# Patient Record
Sex: Female | Born: 1965 | Race: White | Hispanic: No | Smoking: Never smoker
Health system: Southern US, Community
[De-identification: ages and names within clinical notes are randomized; demographics above are authoritative.]

## PROBLEM LIST (undated history)

## (undated) DIAGNOSIS — J302 Other seasonal allergic rhinitis: Secondary | ICD-10-CM

## (undated) DIAGNOSIS — R011 Cardiac murmur, unspecified: Secondary | ICD-10-CM

## (undated) DIAGNOSIS — L52 Erythema nodosum: Secondary | ICD-10-CM

## (undated) HISTORY — DX: Cardiac murmur, unspecified: R01.1

## (undated) HISTORY — DX: Erythema nodosum: L52

## (undated) HISTORY — DX: Other seasonal allergic rhinitis: J30.2

---

## 1993-01-08 HISTORY — PX: CHOLECYSTECTOMY: SHX55

## 1997-02-14 ENCOUNTER — Inpatient Hospital Stay (HOSPITAL_COMMUNITY): Admission: AD | Admit: 1997-02-14 | Discharge: 1997-02-14 | Payer: Self-pay | Admitting: Family Medicine

## 1997-04-05 ENCOUNTER — Inpatient Hospital Stay (HOSPITAL_COMMUNITY): Admission: AD | Admit: 1997-04-05 | Discharge: 1997-04-07 | Payer: Self-pay | Admitting: Obstetrics and Gynecology

## 1997-05-10 ENCOUNTER — Other Ambulatory Visit: Admission: RE | Admit: 1997-05-10 | Discharge: 1997-05-10 | Payer: Self-pay | Admitting: Obstetrics and Gynecology

## 1998-06-23 ENCOUNTER — Other Ambulatory Visit: Admission: RE | Admit: 1998-06-23 | Discharge: 1998-06-23 | Payer: Self-pay | Admitting: Obstetrics and Gynecology

## 1999-01-12 ENCOUNTER — Ambulatory Visit (HOSPITAL_COMMUNITY): Admission: RE | Admit: 1999-01-12 | Discharge: 1999-01-12 | Payer: Self-pay | Admitting: Obstetrics and Gynecology

## 1999-04-19 ENCOUNTER — Ambulatory Visit (HOSPITAL_COMMUNITY): Admission: RE | Admit: 1999-04-19 | Discharge: 1999-04-19 | Payer: Self-pay | Admitting: Obstetrics & Gynecology

## 1999-07-01 ENCOUNTER — Inpatient Hospital Stay (HOSPITAL_COMMUNITY): Admission: AD | Admit: 1999-07-01 | Discharge: 1999-07-03 | Payer: Self-pay | Admitting: Obstetrics and Gynecology

## 1999-08-01 ENCOUNTER — Other Ambulatory Visit: Admission: RE | Admit: 1999-08-01 | Discharge: 1999-08-01 | Payer: Self-pay | Admitting: Obstetrics and Gynecology

## 1999-08-24 ENCOUNTER — Encounter: Payer: Self-pay | Admitting: Family Medicine

## 1999-08-24 ENCOUNTER — Encounter: Admission: RE | Admit: 1999-08-24 | Discharge: 1999-08-24 | Payer: Self-pay | Admitting: Family Medicine

## 2000-08-29 ENCOUNTER — Other Ambulatory Visit: Admission: RE | Admit: 2000-08-29 | Discharge: 2000-08-29 | Payer: Self-pay | Admitting: Obstetrics and Gynecology

## 2001-09-25 ENCOUNTER — Other Ambulatory Visit: Admission: RE | Admit: 2001-09-25 | Discharge: 2001-09-25 | Payer: Self-pay | Admitting: Obstetrics and Gynecology

## 2001-11-11 ENCOUNTER — Inpatient Hospital Stay (HOSPITAL_COMMUNITY): Admission: AD | Admit: 2001-11-11 | Discharge: 2001-11-11 | Payer: Self-pay | Admitting: Obstetrics and Gynecology

## 2002-02-16 ENCOUNTER — Ambulatory Visit (HOSPITAL_COMMUNITY): Admission: RE | Admit: 2002-02-16 | Discharge: 2002-02-16 | Payer: Self-pay | Admitting: Obstetrics & Gynecology

## 2002-05-04 ENCOUNTER — Inpatient Hospital Stay (HOSPITAL_COMMUNITY): Admission: AD | Admit: 2002-05-04 | Discharge: 2002-05-06 | Payer: Self-pay | Admitting: Obstetrics and Gynecology

## 2002-11-03 ENCOUNTER — Other Ambulatory Visit: Admission: RE | Admit: 2002-11-03 | Discharge: 2002-11-03 | Payer: Self-pay | Admitting: Obstetrics and Gynecology

## 2003-11-09 ENCOUNTER — Other Ambulatory Visit: Admission: RE | Admit: 2003-11-09 | Discharge: 2003-11-09 | Payer: Self-pay | Admitting: Obstetrics and Gynecology

## 2004-07-28 ENCOUNTER — Ambulatory Visit: Payer: Self-pay

## 2004-12-21 ENCOUNTER — Other Ambulatory Visit: Admission: RE | Admit: 2004-12-21 | Discharge: 2004-12-21 | Payer: Self-pay | Admitting: Obstetrics and Gynecology

## 2010-07-24 ENCOUNTER — Other Ambulatory Visit: Payer: Self-pay | Admitting: Obstetrics and Gynecology

## 2012-04-28 ENCOUNTER — Encounter: Payer: Self-pay | Admitting: *Deleted

## 2012-04-29 ENCOUNTER — Encounter: Payer: Self-pay | Admitting: Cardiovascular Disease

## 2012-04-29 ENCOUNTER — Ambulatory Visit (INDEPENDENT_AMBULATORY_CARE_PROVIDER_SITE_OTHER): Payer: 59 | Admitting: Cardiovascular Disease

## 2012-04-29 VITALS — BP 118/78 | HR 58 | Resp 12 | Ht 65.0 in | Wt 129.0 lb

## 2012-04-29 DIAGNOSIS — R000 Tachycardia, unspecified: Secondary | ICD-10-CM | POA: Insufficient documentation

## 2012-04-29 DIAGNOSIS — I498 Other specified cardiac arrhythmias: Secondary | ICD-10-CM

## 2012-04-29 NOTE — Assessment & Plan Note (Signed)
Gabriella Grimes presents with a prolonged episode of sinus tachycardia that started during a rigorous game of tennis.  She had recently recovered from a GI virus several days earlier and was probably still slightly volume depleted.  She has not had any recurrent episodes of tachycardia.  At this point, I do not think she needs any further testing.  Her cardiac exam is normal.  We discussed getting an echo and or event monitor if she continues to have these episodes.  I have encouraged her to drink V-8 juice on occasion to hopefully avoid volume depletion.  I will see her on as needed basis.

## 2012-04-29 NOTE — Patient Instructions (Addendum)
Your physician recommends that you schedule a follow-up appointment in: as needed basis  

## 2012-04-29 NOTE — Progress Notes (Signed)
     Gabriella Grimes Date of Birth  December 30, 1965       [facility]    [place] [address]  [address], [address] [place], [place]  [address]   [place], [place]  [address] [phone]     [phone]   Fax  [phone]    Fax [phone]  Problem List: 1. Tachycardia 2. 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   She is healthy - exercises regularly.  Recently she had a tough tennis match and had prolonged tachycardia for an hour or so.  The tachycardia started during the match.   Her HR was not irregular.  She felt "nervous".  She measured her pulse at 105.  She had hydrated well during the match.   She had a stomach virus 3-4 days prior.  She remembers not urinating much following that stomach virus.    She has had some mild left leg swelling.  She is an optometrist.   + family history - mother had a pacer.  Her biological father died suddenly while playing basketball at age 32.    No current outpatient prescriptions on file prior to visit.   No current facility-administered medications on file prior to visit.    No Known Allergies  Past Medical History  Diagnosis Date  . Seasonal allergies   . Erythema nodosum   . Heart murmur     MVP    Past Surgical History  Procedure Laterality Date  . Cholecystectomy  1995    History  Smoking status  . Never Smoker   Smokeless tobacco  . Not on file    History  Alcohol Use  . Yes    Comment: 1-2 per wk    No family history on file.  Reviw of Systems:  Reviewed in the HPI.  All other systems are negative.  Physical Exam: Blood pressure 118/78, pulse 58, resp. rate 12, height 5\' 5"  (1.651 m), weight 129 lb (58.514 kg). General: Well developed, well nourished, in no acute distress.  Head: Normocephalic, atraumatic, sclera non-icteric, mucus membranes are moist,    Neck: Supple. Carotids are 2 + without bruits. No JVD   Lungs: Clear   Heart: RR, normal S1, S2,   Abdomen: Soft, non-tender, non-distended with normal bowel sounds.  Msk:  Strength and tone are normal   Extremities: No clubbing or cyanosis. No edema.  Distal pedal pulses are 2+ and equal    Neuro: CN II - XII intact.  Alert and oriented X 3.   Psych:  Normal   ECG: April 29, 2012:  Sinus brady 54.early repol.  Assessment / Plan:

## 2012-05-26 ENCOUNTER — Encounter: Payer: Self-pay | Admitting: Cardiovascular Disease

## 2012-09-15 ENCOUNTER — Other Ambulatory Visit: Payer: Self-pay | Admitting: Obstetrics and Gynecology

## 2012-10-08 ENCOUNTER — Ambulatory Visit (INDEPENDENT_AMBULATORY_CARE_PROVIDER_SITE_OTHER): Payer: 59 | Admitting: Cardiovascular Disease

## 2012-10-08 ENCOUNTER — Encounter: Payer: Self-pay | Admitting: Cardiovascular Disease

## 2012-10-08 VITALS — BP 110/64 | HR 63 | Ht 65.0 in | Wt 126.0 lb

## 2012-10-08 DIAGNOSIS — R0789 Other chest pain: Secondary | ICD-10-CM | POA: Insufficient documentation

## 2012-10-08 DIAGNOSIS — R002 Palpitations: Secondary | ICD-10-CM

## 2012-10-08 DIAGNOSIS — R000 Tachycardia, unspecified: Secondary | ICD-10-CM

## 2012-10-08 DIAGNOSIS — R600 Localized edema: Secondary | ICD-10-CM

## 2012-10-08 DIAGNOSIS — I498 Other specified cardiac arrhythmias: Secondary | ICD-10-CM

## 2012-10-08 DIAGNOSIS — R609 Edema, unspecified: Secondary | ICD-10-CM

## 2012-10-08 DIAGNOSIS — R079 Chest pain, unspecified: Secondary | ICD-10-CM

## 2012-10-08 NOTE — Assessment & Plan Note (Signed)
And presents today with some atypical episodes of chest discomfort. She sleep on her side and notes that she has date chest pain and tightness which wakes her up middle of the night. He doesn't typically improve if she rolls over or stretches that her chest.  The symptoms are very atypical. She is a Sports coach and so I don't think that she's got significant coronary artery disease. We I do want to get an echocardiogram to make sure that she doesn't have any other structural cardiac or pulmonary abnormalities.  Have asked her to sit up straight stretch her chest out which should help if this is a musculoskeletal issue.

## 2012-10-08 NOTE — Progress Notes (Signed)
        Gabriella Grimes Date of Birth  04-Dec-1965       [facility]    [place] [address]  [address] [place], [place]  [address]   [place], [place]  [address] [phone]     [phone]   Fax  [phone]    Fax [phone]  Problem List: 1. Tachycardia 2. 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   She is healthy - exercises regularly.  Recently she had a tough tennis match and had prolonged tachycardia for an hour or so.  The tachycardia started during the match.   Her HR was not irregular.  She felt "nervous".  She measured her pulse at 105.  She had hydrated well during the match.  She had a stomach virus 3-4 days prior.  She remembers not urinating much following that stomach virus.   She has had some mild left leg swelling.  She is an optometrist.   + family history - mother had a pacer.  Her biological father died suddenly while playing basketball at age 61.  Oct. 1, 2014:  Gabriella Grimes has done well . She has had some episodes of tachycardia - especially after playing tennis.  HR is the 105-110 range.   She has had some CP what awakens her at night.  The pain is a nagging pain.  Just left of her sternum.  She sleeps on her left side.    She also has some episodes of pitting edema.    It seems to be mostly in her left foot.   The swelling always resolves by the next morning.  Family hx of cad - father had MI at age 102 and dropped dead on basketball court.    No current outpatient prescriptions on file prior to visit.   No current facility-administered medications on file prior to visit.    No Known Allergies  Past Medical History  Diagnosis Date  . Seasonal allergies   . Erythema nodosum   . Heart murmur     MVP    Past Surgical History  Procedure Laterality Date  . Cholecystectomy  1995     History  Smoking status  . Never Smoker   Smokeless tobacco  . Not on file    History  Alcohol Use  . Yes    Comment: 1-2 per wk    No family history on file.  Reviw of Systems:  Reviewed in the HPI.  All other systems are negative.  Physical Exam: Blood pressure 110/64, pulse 63, height 5\' 5"  (1.651 m), weight 126 lb (57.153 kg), SpO2 93.00%. General: Well developed, well nourished, in no acute distress.  Head: Normocephalic, atraumatic, sclera non-icteric, mucus membranes are moist,   Neck: Supple. Carotids are 2 + without bruits. No JVD   Lungs: Clear   Heart: RR, normal S1, S2,  No chest wall tenderness  Abdomen: Soft, non-tender, non-distended with normal bowel sounds.  Msk:  Strength and tone are normal   Extremities: No clubbing or cyanosis. No edema.  Distal pedal pulses are 2+ and equal    Neuro: CN II - XII intact.  Alert and oriented X 3.   Psych:  Normal   ECG: April 29, 2012:  Sinus brady 54.early repol.  Assessment / Plan:

## 2012-10-08 NOTE — Assessment & Plan Note (Signed)
She continues to have some episodes of sinus tachycardia although they're not as bad as they were previously.

## 2012-10-08 NOTE — Assessment & Plan Note (Signed)
Gabriella Grimes is no edema on exam today. She should be several pictures on her phone that showed significant 2+ pitting edema. She does not recall eating a lot of salty foods prior to setting. She's had this off and on for the past several years. She's been seen by an orthopedist and son have no abnormalities, structural standpoint. I tried to get an echocardiogram for further evaluation of her left ventricular function and also right ventricular function. Was negative venous duplex and mentioned that she doesn't have any evidence of venous thrombosis. She does have a history of leg fractures in both legs as a child.

## 2012-10-08 NOTE — Patient Instructions (Addendum)
Your physician has requested that you have an echocardiogram. Echocardiography is a painless test that uses sound waves to create images of your heart. It provides your doctor with information about the size and shape of your heart and how well your heart's chambers and valves are working. This procedure takes approximately one hour. There are no restrictions for this procedure.  Your physician has requested that you have a lower  extremity venous duplex. This test is an ultrasound of the veins in the legs or arms. It looks at venous blood flow that carries blood from the heart to the legs or arms. Allow one hour for a Lower Venous exam. Allow thirty minutes for an Upper Venous exam. There are no restrictions or special instructions.  Your physician recommends that you schedule a follow-up appointment in: 2-3 months

## 2012-10-22 ENCOUNTER — Ambulatory Visit (HOSPITAL_COMMUNITY): Payer: 59 | Attending: Cardiovascular Disease | Admitting: Radiology

## 2012-10-22 ENCOUNTER — Other Ambulatory Visit (HOSPITAL_COMMUNITY): Payer: 59

## 2012-10-22 DIAGNOSIS — Z8249 Family history of ischemic heart disease and other diseases of the circulatory system: Secondary | ICD-10-CM | POA: Insufficient documentation

## 2012-10-22 DIAGNOSIS — R600 Localized edema: Secondary | ICD-10-CM

## 2012-10-22 DIAGNOSIS — R609 Edema, unspecified: Secondary | ICD-10-CM | POA: Insufficient documentation

## 2012-10-22 DIAGNOSIS — R002 Palpitations: Secondary | ICD-10-CM

## 2012-10-22 DIAGNOSIS — R072 Precordial pain: Secondary | ICD-10-CM

## 2012-10-22 DIAGNOSIS — I079 Rheumatic tricuspid valve disease, unspecified: Secondary | ICD-10-CM | POA: Insufficient documentation

## 2012-10-22 DIAGNOSIS — R079 Chest pain, unspecified: Secondary | ICD-10-CM | POA: Insufficient documentation

## 2012-10-22 NOTE — Progress Notes (Signed)
Echocardiogram performed.  

## 2012-10-29 ENCOUNTER — Ambulatory Visit (HOSPITAL_COMMUNITY): Payer: 59 | Attending: Cardiovascular Disease

## 2012-10-29 DIAGNOSIS — R002 Palpitations: Secondary | ICD-10-CM

## 2012-10-29 DIAGNOSIS — I059 Rheumatic mitral valve disease, unspecified: Secondary | ICD-10-CM | POA: Insufficient documentation

## 2012-10-29 DIAGNOSIS — M7989 Other specified soft tissue disorders: Secondary | ICD-10-CM | POA: Insufficient documentation

## 2012-10-29 DIAGNOSIS — R609 Edema, unspecified: Secondary | ICD-10-CM

## 2012-10-29 DIAGNOSIS — R600 Localized edema: Secondary | ICD-10-CM

## 2012-10-29 DIAGNOSIS — R079 Chest pain, unspecified: Secondary | ICD-10-CM

## 2012-12-29 ENCOUNTER — Ambulatory Visit: Payer: 59 | Admitting: Cardiovascular Disease

## 2013-01-21 ENCOUNTER — Ambulatory Visit: Payer: 59 | Admitting: Cardiovascular Disease

## 2013-02-18 ENCOUNTER — Ambulatory Visit: Payer: 59 | Admitting: Cardiovascular Disease

## 2013-02-20 ENCOUNTER — Encounter: Payer: Self-pay | Admitting: Cardiovascular Disease

## 2013-02-20 ENCOUNTER — Ambulatory Visit (INDEPENDENT_AMBULATORY_CARE_PROVIDER_SITE_OTHER): Payer: 59 | Admitting: Cardiovascular Disease

## 2013-02-20 VITALS — BP 100/70 | HR 62 | Ht 65.0 in | Wt 131.0 lb

## 2013-02-20 DIAGNOSIS — R0789 Other chest pain: Secondary | ICD-10-CM

## 2013-02-20 DIAGNOSIS — I498 Other specified cardiac arrhythmias: Secondary | ICD-10-CM

## 2013-02-20 DIAGNOSIS — R000 Tachycardia, unspecified: Secondary | ICD-10-CM

## 2013-02-20 NOTE — Patient Instructions (Signed)
Your physician recommends that you schedule a follow-up appointment in: AS NEEDED  Your physician recommends that you continue on your current medications as directed. Please refer to the Current Medication list given to you today.  

## 2013-02-20 NOTE — Progress Notes (Signed)
Gabriella Grimes Date of Birth  10/01/1965       [facility]    [place] [address]  [address] [place], [place]  [address]   [place], [place]  [address] [phone]     [phone]   Fax  [phone]    Fax [phone]  Problem List: 1. Tachycardia 2. 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   She is healthy - exercises regularly.  Recently she had a tough tennis match and had prolonged tachycardia for an hour or so.  The tachycardia started during the match.   Her HR was not irregular.  She felt "nervous".  She measured her pulse at 105.  She had hydrated well during the match.  She had a stomach virus 3-4 days prior.  She remembers not urinating much following that stomach virus.   She has had some mild left leg swelling.  She is an optometrist.   + family history - mother had a pacer.  Her biological father died suddenly while playing basketball at age 65.  Oct. 1, 2014:  Gabriella Grimes has done well . She has had some episodes of tachycardia - especially after playing tennis.  HR is the 105-110 range.   She has had some CP what awakens her at night.  The pain is a nagging pain.  Just left of her sternum.  She sleeps on her left side.    She also has some episodes of pitting edema.    It seems to be mostly in her left foot.   The swelling always resolves by the next morning.  Family hx of cad - father had MI at age 37 and dropped dead on basketball court.  Mar 05, 2013:  Gabriella Grimes is doing ok  .  She has very brief episodes of palpitations.  Also has some mild left chest pain when she is under stress.  Never with exercise.    No current outpatient prescriptions on file prior to visit.   No current facility-administered medications on file prior to visit.    No Known Allergies  Past Medical History    Diagnosis Date  . Seasonal allergies   . Erythema nodosum   . Heart murmur     MVP    Past Surgical History  Procedure Laterality Date  . Cholecystectomy  1995    History  Smoking status  . Never Smoker   Smokeless tobacco  . Not on file    History  Alcohol Use  . Yes    Comment: 1-2 per wk    No family history on file.  Reviw of Systems:  Reviewed in the HPI.  All other systems are negative.  Physical Exam: Blood pressure 100/70, pulse 62, height 5\' 5"  (1.651 m), weight 131 lb (59.421 kg). General: Well developed, well nourished, in no acute distress.  Head: Normocephalic, atraumatic, sclera non-icteric, mucus membranes are moist,   Neck: Supple. Carotids are 2 + without bruits. No JVD   Lungs: Clear   Heart: RR, normal S1, S2,  No chest wall tenderness  Abdomen: Soft, non-tender, non-distended with normal bowel sounds.  Msk:  Strength and tone are normal   Extremities: No clubbing or cyanosis. No edema.  Distal pedal pulses are 2+ and equal    Neuro: CN II - XII intact.  Alert and oriented X 3.   Psych:  Normal   ECG:  02/20/2013: Normal sinus rhythm at 62. She has no ST or T wave changes.  Assessment / Plan:

## 2013-02-22 NOTE — Assessment & Plan Note (Signed)
Gabriella Grimes continues to have some palpitations but nothing severe.  I think that her symptoms are benign.    We will continue to follow .

## 2013-09-23 ENCOUNTER — Other Ambulatory Visit: Payer: Self-pay | Admitting: Obstetrics and Gynecology

## 2013-09-24 LAB — CYTOLOGY - PAP

## 2014-01-01 ENCOUNTER — Other Ambulatory Visit: Payer: Self-pay | Admitting: Cardiovascular Disease

## 2014-01-01 ENCOUNTER — Telehealth: Payer: Self-pay | Admitting: Cardiovascular Disease

## 2014-01-01 MED ORDER — PROPRANOLOL HCL 10 MG PO TABS: 10.0000 mg | ORAL_TABLET | Freq: Four times a day (QID) | ORAL | Status: DC | PRN

## 2014-01-01 NOTE — Telephone Encounter (Signed)
Received text from [facility] that she was having significant palpitations tonight I made a house call and discussed these with her and her husband On exam BP 115/80 HR 80-90 Heart :  Very irregular followed by several normal beats .  soft systolic murmur and soft diastolic murmur.  No excessive ETOH intake.  No significant caffeine I suspect that she has paroxysmal atrial fib. She is basically asymptomatic.  No CP , no dizziness.  CHADS VASC2 score is 0.  Will send in a script for porpranolol 10 mg QID PRN to [facility], [employer]  I will check in with her tomorrow. If she is still in this irregular rhythm on Monday,  Will have her come by for ECG, work in office visit.  I will be in the hospital but can come over for office visit.  She will need an echo.    [name], Montez Hageman., MD, [facility] 01/01/2014, 10:30 PM [address]  [address] Office [phone] Pager [phone]

## 2014-01-01 NOTE — Progress Notes (Signed)
Gabriella Grimes called our house tonight with complaints of palpitations. I made a house call. Discussed with her and her husband, [name],  Patient was examined BP 115/80 HR 80-90 - very irregular followed by periods of RR for 3-4 heart beats.  Soft systolic murmur , ? Soft diastolic murmur on cardiac exam.  Her exam is c/w atrial fibrilation. She has had palpitations in the past but has not been diagnosed with atrial fib..  Will send in a script for propranolol 10 mg QID , # 30. To [facility] on [employer] Encouraged her to increase intake of potassium and magnesium .   I will check in with her tomorrow . She will need an echo  This week.  I can see her briefly for a work in visit and ECG on Monday if she is still in the irregular rhythm .   [name], Montez Hageman., MD, [facility] 01/01/2014, 10:15 PM [address],  [address] Office [phone] Pager [phone]

## 2014-01-04 ENCOUNTER — Telehealth: Payer: Self-pay | Admitting: Cardiovascular Disease

## 2014-01-04 DIAGNOSIS — R002 Palpitations: Secondary | ICD-10-CM

## 2014-01-04 NOTE — Telephone Encounter (Signed)
Left message for patient to call.

## 2014-01-04 NOTE — Addendum Note (Signed)
Addended by: Judy Pimple on: 01/04/2014 02:37 PM   Modules accepted: Orders

## 2014-01-04 NOTE — Telephone Encounter (Signed)
Shiniqua's palpitations have resolved. She has been having some atypical CP  She will need a 30 day event monitor.  I do not think that she needs the echo that I had requested in my previous notes since she had an echo in Oct. 2014.  Please also set up an appt. To see me in several weeks.   [name], Montez Hageman., MD, [facility] 01/04/2014, 9:53 AM [address]  [address] Office [phone] Pager [phone]

## 2014-01-04 NOTE — Telephone Encounter (Signed)
Spoke with patient. She is agreeable to this plan. Will order 30 day event monitor for her. She voiced good understanding.

## 2014-01-05 ENCOUNTER — Encounter: Payer: Self-pay | Admitting: *Deleted

## 2014-01-05 ENCOUNTER — Encounter (INDEPENDENT_AMBULATORY_CARE_PROVIDER_SITE_OTHER): Payer: 59

## 2014-01-05 DIAGNOSIS — R002 Palpitations: Secondary | ICD-10-CM

## 2014-01-05 NOTE — Progress Notes (Signed)
Patient ID: Gabriella Grimes, female   DOB: Feb 09, 1965, 48 y.o.   MRN: [id number] [employer] 30 day cardiac event monitor applied to patient.

## 2014-01-06 ENCOUNTER — Telehealth: Payer: Self-pay | Admitting: Nurse Practitioner

## 2014-01-06 NOTE — Telephone Encounter (Signed)
Called and spoke with patient to schedule appointment with Dr. Elease Hashimoto per his request.  Patient states she is doing well; asked about exercising while wearing heart monitor.  I advised that patient may exercise and to document the date/time so that if an alert is received, we will know that she was exercising during that time.  Patient verbalized understanding and agreement and scheduled for 01/27/14 with Dr. Elease Hashimoto.

## 2014-01-27 ENCOUNTER — Ambulatory Visit (INDEPENDENT_AMBULATORY_CARE_PROVIDER_SITE_OTHER): Payer: BLUE CROSS/BLUE SHIELD | Admitting: Cardiovascular Disease

## 2014-01-27 ENCOUNTER — Encounter: Payer: Self-pay | Admitting: Cardiovascular Disease

## 2014-01-27 VITALS — BP 110/70 | HR 49 | Ht 65.0 in | Wt 130.0 lb

## 2014-01-27 DIAGNOSIS — R0789 Other chest pain: Secondary | ICD-10-CM

## 2014-01-27 DIAGNOSIS — I480 Paroxysmal atrial fibrillation: Secondary | ICD-10-CM

## 2014-01-27 DIAGNOSIS — I4891 Unspecified atrial fibrillation: Secondary | ICD-10-CM | POA: Insufficient documentation

## 2014-01-27 MED ORDER — ASPIRIN EC 81 MG PO TBEC: 81.0000 mg | DELAYED_RELEASE_TABLET | Freq: Every day | ORAL | Status: DC

## 2014-01-27 NOTE — Patient Instructions (Signed)
Your physician has recommended you make the following change in your medication:  START Aspirin 81 mg once daily  Your physician wants you to follow-up in: 1 year with Dr. Elease Hashimoto.  You will receive a reminder letter in the mail two months in advance. If you don't receive a letter, please call our office to schedule the follow-up appointment.

## 2014-01-27 NOTE — Progress Notes (Signed)
Cardiology Office Note   Date:  01/27/2014   ID:  Gabriella Grimes, DOB 1965/05/31, MRN [id number]  PCP:  No primary care provider on file.  Cardiologist:   Nahser, Deloris Ping, MD   Chief Complaint  Patient presents with  . Follow-up    paroxysmal atrial fib      History of Present Illness: Gabriella Grimes is a 49 y.o. female who presents for follow up of her paroxysmal atrial fib.    has had intermittent palpitations and atypical chest pain for the past several years. I saw her during a house visit on December 24. She was having heartbeat irregularities. . Clinically the rhythm felt like atrial fibrillation. She was having some chest discomfort but her rhythm was completely normal. We placed an event monitor.  Event monitor reveals predominantly normal sinus rhythm. She has frequent premature atrial contractions , occasional PVCs. She has also had some runs of paroxysmal atrial fibrillation.  She remains active.  She hurt her back so she has not been as active as she normally is. Runs on occasion.    Past Medical History  Diagnosis Date  . Seasonal allergies   . Erythema nodosum   . Heart murmur     MVP    Past Surgical History  Procedure Laterality Date  . Cholecystectomy  1995     Current Outpatient Prescriptions  Medication Sig Dispense Refill  . propranolol (INDERAL) 10 MG tablet Take 1 tablet (10 mg total) by mouth 4 (four) times daily as needed. (Patient not taking: Reported on 01/27/2014) 30 tablet 12   No current facility-administered medications for this visit.    Allergies:   Review of patient's allergies indicates no known allergies.    Social History:  The patient  reports that she has never smoked. She does not have any smokeless tobacco history on file. She reports that she drinks alcohol.   Family History:  The patient's family history includes Arrhythmia in her mother; Diabetes in her mother; Heart attack in her father.    ROS:  Please see the  history of present illness.   Otherwise, review of systems are positive for occasional back pains.  Occasional typical CP .   All other systems are reviewed and negative.    PHYSICAL EXAM: VS:  BP 110/70 mmHg  Pulse 49  Ht  (1.651 m)  Wt 130 lb (58.968 kg)  BMI 21.63 kg/m2  SpO2 99% , BMI Body mass index is 21.63 kg/(m^2). GEN: Well nourished, well developed, in no acute distress HEENT: normal Neck: no JVD, carotid bruits, or masses Cardiac: RRR; no murmurs, rubs, or gallops,no edema  Respiratory:  clear to auscultation bilaterally, normal work of breathing GI: soft, nontender, nondistended, + BS MS: no deformity or atrophy Skin: warm and dry, no rash Neuro:  Strength and sensation are intact Psych: euthymic mood, full affect   EKG:  EKG is not ordered today.   Recent Labs: No results found for requested labs within last 365 days.    Lipid Panel No results found for: CHOL, TRIG, HDL, CHOLHDL, VLDL, LDLCALC, LDLDIRECT    Wt Readings from Last 3 Encounters:  01/27/14 130 lb (58.968 kg)  02/20/13 131 lb (59.421 kg)  10/08/12 126 lb (57.153 kg)      Other studies Reviewed: Additional studies/ records that were reviewed today include: 30 day event monitor. Review of the above records demonstrates: paroxysmal atrial fib   ASSESSMENT AND PLAN:  1.   Paroxysmal atrial  ablation: The patient has evidence of paroxysmal atrial fibrillation on the 30 day event monitor. Clinically this is what her exam showed onto sober 24th when I saw her during on home visit.  Her CHADS 2 VASC score is 0.   I do not think that she needs any adequate lesion. I have advised her to take a baby aspirin once a day.   she has a family history of pacemaker in her mother. It's quite likely that she had sick sinus syndrome as well.  We discussed the fact that she  Unit candidate when she gets older. Currently her heart rate is fairly slow and I do not think that she will tolerate any beta blocker  at this time. We'll continue to follow her for now. Here again in one year.   2. Chest pain: Has atypical chest pain. These episodes seem to be more related to musculoskeletal abdomen allergies. I've encouraged her to see a physical therapist and/or her chiropractor as needed. I do not think that this needs any additional workup.  Current medicines are reviewed at length with the patient today.  The patient does not have concerns regarding medicines.  The following changes have been made:  no change  Labs/ tests ordered today include:  No orders of the defined types were placed in this encounter.     Disposition:   FU with me  in 1 year   Signed, Nahser, Deloris Ping, MD  01/27/2014 11:39 AM    [facility] [address] [place], [place], [place]  [address] Phone: [phone]; Fax: [phone]

## 2014-02-12 ENCOUNTER — Telehealth: Payer: Self-pay | Admitting: Nurse Practitioner

## 2014-02-12 NOTE — Telephone Encounter (Signed)
Called patient and reviewed results of 30 day monitor.  Per Dr. Elease Hashimoto:  NSR with atrial runs; has paroxysmal atrial fib that we have seen on a previous tracing. Patient states she does not notice the irregular heart beat unless she is sitting still.  States she has not taken the Propranolol.  I advised patient to take the Propranolol as needed and that if she has worsening symptoms or more frequent episodes of atrial fibrillation to call the office.  I advised that treatment for her atrial fibrillation would be low dose beta blocker, however patient usually has lower blood pressure and heart rate and may not tolerate this medication.  Patient verbalized understanding and agreement and will call back with concerns.

## 2014-03-01 ENCOUNTER — Other Ambulatory Visit: Payer: Self-pay | Admitting: Obstetrics and Gynecology

## 2014-10-06 ENCOUNTER — Other Ambulatory Visit: Payer: Self-pay | Admitting: Obstetrics and Gynecology

## 2014-10-07 LAB — CYTOLOGY - PAP

## 2015-10-11 ENCOUNTER — Other Ambulatory Visit: Payer: Self-pay | Admitting: Obstetrics and Gynecology

## 2015-10-12 LAB — CYTOLOGY - PAP

## 2016-01-30 NOTE — Progress Notes (Signed)
Cardiology Office Note   Date:  01/31/2016   ID:  Gabriella Grimes, DOB 29-Dec-1965, MRN [id number]  PCP:  No primary care provider on file.  Cardiologist:   Kristeen Miss, MD   Chief Complaint  Patient presents with  . Follow-up    PAF       History of Present Illness: Gabriella Grimes is a 51 y.o. female who presents for follow up of her paroxysmal atrial fib.    has had intermittent palpitations and atypical chest pain for the past several years. I saw her during a house visit on December 24. She was having heartbeat irregularities. . Clinically the rhythm felt like atrial fibrillation. She was having some chest discomfort but her rhythm was completely normal. We placed an event monitor.  Event monitor reveals predominantly normal sinus rhythm. She has frequent premature atrial contractions , occasional PVCs. She has also had some runs of paroxysmal atrial fibrillation.  She remains active.  She hurt her back so she has not been as active as she normally is. Runs on occasion.    Jan. 23, 2018:  Doing well. Thinks she has had an episode of SVT and another episode of PAF .   Was playing tennis at the time.   Felt a little faint but played through the episode.  Associated with a stressful time  Still running some.   Does the elllptical and stair climber regularly .     Past Medical History:  Diagnosis Date  . Erythema nodosum   . Heart murmur    MVP  . Seasonal allergies     Past Surgical History:  Procedure Laterality Date  . CHOLECYSTECTOMY  1995     Current Outpatient Prescriptions  Medication Sig Dispense Refill  . aspirin EC 81 MG tablet Take 1 tablet (81 mg total) by mouth daily.     No current facility-administered medications for this visit.     Allergies:   Patient has no known allergies.    Social History:  The patient  reports that she has never smoked. She has never used smokeless tobacco. She reports that she drinks alcohol.   Family History:  The  patient's family history includes Arrhythmia in her mother; Diabetes in her mother; Heart attack in her father.    ROS:  Please see the history of present illness.   Otherwise, review of systems are positive for occasional back pains.  Occasional typical CP .   All other systems are reviewed and negative.    PHYSICAL EXAM: VS:  BP 104/64   Pulse (!) 57   Ht 5\' 5"  (1.651 m)   Wt 129 lb (58.5 kg)   SpO2 99%   BMI 21.47 kg/m  , BMI Body mass index is 21.47 kg/m. GEN: Well nourished, well developed, in no acute distress  HEENT: normal  Neck: no JVD, carotid bruits, or masses Cardiac: RRR; no murmurs, rubs, or gallops,no edema  Respiratory:  clear to auscultation bilaterally, normal work of breathing GI: soft, nontender, nondistended, + BS MS: no deformity or atrophy  Skin: warm and dry, no rash Neuro:  Strength and sensation are intact Psych: euthymic mood, full affect   EKG:  EKG is ordered today.  Sinus brady at 56.  Normal.    Recent Labs: No results found for requested labs within last 8760 hours.    Lipid Panel No results found for: CHOL, TRIG, HDL, CHOLHDL, VLDL, LDLCALC, LDLDIRECT    Wt Readings from Last 3 Encounters:  01/31/16 129 lb (58.5 kg)  01/27/14 130 lb (59 kg)  02/20/13 131 lb (59.4 kg)      Other studies Reviewed: Additional studies/ records that were reviewed today include: 30 day event monitor. Review of the above records demonstrates: paroxysmal atrial fib   ASSESSMENT AND PLAN:  1.   Paroxysmal atrial fib: The patient has evidence of paroxysmal atrial fibrillation on the 30 day event monitor.   Her CHADS 2 VASC score is 0.   I do not think that she needs any adequate lesion. Continue  a baby aspirin once a day.   she has a family history of pacemaker in her mother. It's quite likely that she had sick sinus syndrome as well.   She'll give [place] a call if she wants to have her propranolol refilled.  Current medicines are reviewed at length with  the patient today.  The patient does not have concerns regarding medicines.  The following changes have been made:  no change  Labs/ tests ordered today include:  No orders of the defined types were placed in this encounter.  Disposition:   FU with me  in 1 year   Signed, Kristeen Miss, MD  01/31/2016 8:33 AM    [facility] [address] [place], [place], [place]  [address] Phone: [phone]; Fax: [phone]

## 2016-01-31 ENCOUNTER — Ambulatory Visit (INDEPENDENT_AMBULATORY_CARE_PROVIDER_SITE_OTHER): Payer: BLUE CROSS/BLUE SHIELD | Admitting: Cardiovascular Disease

## 2016-01-31 ENCOUNTER — Encounter (INDEPENDENT_AMBULATORY_CARE_PROVIDER_SITE_OTHER): Payer: Self-pay

## 2016-01-31 ENCOUNTER — Encounter: Payer: Self-pay | Admitting: Cardiovascular Disease

## 2016-01-31 VITALS — BP 104/64 | HR 57 | Ht 65.0 in | Wt 129.0 lb

## 2016-01-31 DIAGNOSIS — I480 Paroxysmal atrial fibrillation: Secondary | ICD-10-CM

## 2016-01-31 NOTE — Patient Instructions (Signed)
Medication Instructions:  Your physician recommends that you continue on your current medications as directed. Please refer to the Current Medication list given to you today.   Labwork: None Ordered   Testing/Procedures: None Ordered   Follow-Up: Your physician wants you to follow-up in: 1 year with Dr. Nahser.  You will receive a reminder letter in the mail two months in advance. If you don't receive a letter, please call our office to schedule the follow-up appointment.   If you need a refill on your cardiac medications before your next appointment, please call your pharmacy.   Thank you for choosing [facility]! Amil Bouwman, RN [phone]    

## 2016-02-21 ENCOUNTER — Ambulatory Visit (INDEPENDENT_AMBULATORY_CARE_PROVIDER_SITE_OTHER): Payer: BLUE CROSS/BLUE SHIELD | Admitting: Cardiovascular Disease

## 2016-02-21 ENCOUNTER — Encounter: Payer: Self-pay | Admitting: Cardiovascular Disease

## 2016-02-21 VITALS — BP 114/84 | HR 104 | Ht 65.0 in | Wt 127.2 lb

## 2016-02-21 DIAGNOSIS — I471 Supraventricular tachycardia: Secondary | ICD-10-CM | POA: Insufficient documentation

## 2016-02-21 MED ORDER — PROPRANOLOL HCL 10 MG PO TABS: 10.0000 mg | ORAL_TABLET | Freq: Four times a day (QID) | ORAL | 6 refills | Status: DC | PRN

## 2016-02-21 NOTE — Progress Notes (Signed)
Cardiology Office Note   Date:  02/21/2016   ID:  Gabriella Grimes, DOB 1965/05/21, MRN [id number]  PCP:  No primary care provider on file.  Cardiologist:   Kristeen Miss, MD   Chief Complaint  Patient presents with  . Tachycardia      Previous notes.  Gabriella Grimes is a 51 y.o. female who presents for follow up of her paroxysmal atrial fib.    has had intermittent palpitations and atypical chest pain for the past several years. I saw her during a house visit on December 24. She was having heartbeat irregularities. . Clinically the rhythm felt like atrial fibrillation. She was having some chest discomfort but her rhythm was completely normal. We placed an event monitor.  Event monitor reveals predominantly normal sinus rhythm. She has frequent premature atrial contractions , occasional PVCs. She has also had some runs of paroxysmal atrial fibrillation.  She remains active.  She hurt her back so she has not been as active as she normally is. Runs on occasion.    Jan. 23, 2018:  Doing well. Thinks she has had an episode of SVT and another episode of PAF .   Was playing tennis at the time.   Felt a little faint but played through the episode.  Associated with a stressful time  Still running some.   Does the elllptical and stair climber regularly .     Feb. 13, 2018:  [name] Is seen today as a work in visit. She started having episodes of tachycardia while playing tennis. Her rate was around 110. She was brought in for an EKG and further evaluation.  Her EKG revealed junctional tachycardia at a rate of 110. She had retrograde P waves. She was hemodynamically very stable.  Past Medical History:  Diagnosis Date  . Erythema nodosum   . Heart murmur    MVP  . Seasonal allergies     Past Surgical History:  Procedure Laterality Date  . CHOLECYSTECTOMY  1995     Current Outpatient Prescriptions  Medication Sig Dispense Refill  . aspirin EC 81 MG tablet Take 1 tablet (81 mg  total) by mouth daily.    . propranolol (INDERAL) 10 MG tablet Take 1 tablet (10 mg total) by mouth 4 (four) times daily as needed. 60 tablet 6   No current facility-administered medications for this visit.     Allergies:   Patient has no known allergies.    Social History:  The patient  reports that she has never smoked. She has never used smokeless tobacco. She reports that she drinks alcohol.   Family History:  The patient's family history includes Arrhythmia in her mother; Diabetes in her mother; Heart attack in her father.    ROS:  Please see the history of present illness.   Otherwise, review of systems are positive for occasional back pains.  Occasional typical CP .   All other systems are reviewed and negative.    PHYSICAL EXAM: VS:  BP 114/84 (BP Location: Left Arm, Patient Position: Supine, Cuff Size: Normal)   Pulse (!) 104   Ht 5\' 5"  (1.651 m)   Wt 127 lb 3.2 oz (57.7 kg)   BMI 21.17 kg/m  , BMI Body mass index is 21.17 kg/m. GEN: Well nourished, well developed, in no acute distress  HEENT: normal  Neck: no JVD, carotid bruits, or masses Cardiac: RRR; no murmurs, rubs, or gallops,no edema ,   Tachycardic  Respiratory:  clear to auscultation bilaterally,  normal work of breathing GI: soft, nontender, nondistended, + BS MS: no deformity or atrophy  Skin: warm and dry, no rash Neuro:  Strength and sensation are intact Psych: euthymic mood, full affect   EKG:  EKG is ordered today.  Sinus brady at 56.  Normal.    Recent Labs: No results found for requested labs within last 8760 hours.    Lipid Panel No results found for: CHOL, TRIG, HDL, CHOLHDL, VLDL, LDLCALC, LDLDIRECT    Wt Readings from Last 3 Encounters:  02/21/16 127 lb 3.2 oz (57.7 kg)  01/31/16 129 lb (58.5 kg)  01/27/14 130 lb (59 kg)      Other studies Reviewed: Additional studies/ records that were reviewed today include: 30 day event monitor. Review of the above records demonstrates:  paroxysmal atrial fib   ASSESSMENT AND PLAN:  1.  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 She had retrograde P waves just following the QRS. We tried carotid sinus massage but this did not break the tachycardia. We then sent her over to nuclear medicine to prepare for adenosine injection and she converted on the way. Her follow-up EKG revealed normal sinus rhythm at a rate of 65.  We discussed with her the ways to terminate this tachycardia. These included Valsalva maneuver and diving reflex. We will also give her some propranolol to take on an as-needed basis. I'll see her again in 3 months for follow-up visit. If these do not work then we could consider ablation.   Her heart rate was not fast during this episode of junctional tachycardia.  2.   Paroxysmal atrial fib: The patient has evidence of paroxysmal atrial fibrillation on the 30 day event monitor.   Her CHADS 2 VASC score is 0.   I do not think that she needs any adequate lesion. Continue  a baby aspirin once a day.   she has a family history of pacemaker in her mother. It's quite likely that she had sick sinus syndrome as well.   She'll give [place] a call if she wants to have her propranolol refilled.  Current medicines are reviewed at length with the patient today.  The patient does not have concerns regarding medicines.  The following changes have been made:  no change  Labs/ tests ordered today include:   Orders Placed This Encounter  Procedures  . EKG 12-Lead   Disposition:   FU with me  In 3-4 months    Signed, Kristeen Miss, MD  02/21/2016 1:03 PM    [facility] [address] [place], [place], [place]  [address] Phone: [phone]; Fax: [phone]

## 2016-02-21 NOTE — Patient Instructions (Addendum)
Medication Instructions:  TAKE Propranolol 10 mg up to 4 times per day as needed  Labwork: None Ordered   Testing/Procedures: None Ordered   Follow-Up: Your physician recommends that you schedule a follow-up appointment in: 3 months with Dr. Elease Hashimoto   If you need a refill on your cardiac medications before your next appointment, please call your pharmacy.   Thank you for choosing [facility]! Eligha Bridegroom, RN [phone]

## 2016-04-03 ENCOUNTER — Other Ambulatory Visit: Payer: Self-pay | Admitting: Orthopedic Surgery

## 2016-04-03 DIAGNOSIS — R2231 Localized swelling, mass and lump, right upper limb: Secondary | ICD-10-CM

## 2016-04-05 ENCOUNTER — Other Ambulatory Visit: Payer: BLUE CROSS/BLUE SHIELD

## 2016-04-11 ENCOUNTER — Ambulatory Visit
Admission: RE | Admit: 2016-04-11 | Discharge: 2016-04-11 | Disposition: A | Discharge status: Home / Self Care | Payer: BLUE CROSS/BLUE SHIELD | Source: Ambulatory Visit | Attending: Orthopedic Surgery | Admitting: Orthopedic Surgery

## 2016-04-11 DIAGNOSIS — R2231 Localized swelling, mass and lump, right upper limb: Secondary | ICD-10-CM

## 2016-05-15 ENCOUNTER — Encounter: Payer: Self-pay | Admitting: Cardiovascular Disease

## 2016-06-01 ENCOUNTER — Ambulatory Visit: Payer: BLUE CROSS/BLUE SHIELD | Admitting: Cardiovascular Disease

## 2016-10-03 ENCOUNTER — Ambulatory Visit (INDEPENDENT_AMBULATORY_CARE_PROVIDER_SITE_OTHER): Payer: BLUE CROSS/BLUE SHIELD | Admitting: Cardiovascular Disease

## 2016-10-03 ENCOUNTER — Encounter (INDEPENDENT_AMBULATORY_CARE_PROVIDER_SITE_OTHER): Payer: Self-pay

## 2016-10-03 ENCOUNTER — Encounter: Payer: Self-pay | Admitting: Cardiovascular Disease

## 2016-10-03 VITALS — BP 102/62 | HR 54 | Ht 65.0 in | Wt 125.0 lb

## 2016-10-03 DIAGNOSIS — I471 Supraventricular tachycardia: Secondary | ICD-10-CM

## 2016-10-03 DIAGNOSIS — I480 Paroxysmal atrial fibrillation: Secondary | ICD-10-CM

## 2016-10-03 DIAGNOSIS — Z8249 Family history of ischemic heart disease and other diseases of the circulatory system: Secondary | ICD-10-CM | POA: Diagnosis not present

## 2016-10-03 DIAGNOSIS — Z13220 Encounter for screening for lipoid disorders: Secondary | ICD-10-CM

## 2016-10-03 NOTE — Progress Notes (Signed)
Cardiology Office Note   Date:  10/03/2016   ID:  Gabriella Grimes, DOB 1965-06-04, MRN [id number]  PCP:  Patient, No Pcp Per  Cardiologist:   Kristeen Miss, MD   Chief Complaint  Patient presents with  . Follow-up    SVT, PAF       Previous notes.  Gabriella Grimes is a 51 y.o. female who presents for follow up of her paroxysmal atrial fib.    has had intermittent palpitations and atypical chest pain for the past several years. I saw her during a house visit on December 24. She was having heartbeat irregularities. . Clinically the rhythm felt like atrial fibrillation. She was having some chest discomfort but her rhythm was completely normal. We placed an event monitor.  Event monitor reveals predominantly normal sinus rhythm. She has frequent premature atrial contractions , occasional PVCs. She has also had some runs of paroxysmal atrial fibrillation.  She remains active.  She hurt her back so she has not been as active as she normally is. Runs on occasion.    Jan. 23, 2018:  Doing well. Thinks she has had an episode of SVT and another episode of PAF .   Was playing tennis at the time.   Felt a little faint but played through the episode.  Associated with a stressful time  Still running some.   Does the elllptical and stair climber regularly .     Feb. 13, 2018:  Gabriella Grimes Is seen today as a work in visit. She started having episodes of tachycardia while playing tennis. Her rate was around 110. She was brought in for an EKG and further evaluation.  Her EKG revealed junctional tachycardia at a rate of 110. She had retrograde P waves. She was hemodynamically very stable.  Sept. 26, 2018:    No tachycardia .  Occasional short burst ( several seconds)  Seems to be more stress  Rare episodes of CP - not related to activity  Wants to have LPa drawn .     Past Medical History:  Diagnosis Date  . Erythema nodosum   . Heart murmur    MVP  . Seasonal allergies     Past  Surgical History:  Procedure Laterality Date  . CHOLECYSTECTOMY  1995     Current Outpatient Prescriptions  Medication Sig Dispense Refill  . aspirin EC 81 MG tablet Take 1 tablet (81 mg total) by mouth daily.    Marland Kitchen glucosamine-chondroitin 500-400 MG tablet Take 2 tablets by mouth every morning.     No current facility-administered medications for this visit.     Allergies:   Patient has no known allergies.    Social History:  The patient  reports that she has never smoked. She has never used smokeless tobacco. She reports that she drinks alcohol. She reports that she does not use drugs.   Family History:  The patient's family history includes Arrhythmia in her mother; Diabetes in her mother; Heart attack in her father.    ROS:  Please see the history of present illness.   Otherwise, review of systems are positive for occasional back pains.  Occasional typical CP .   All other systems are reviewed and negative.   Physical Exam: Blood pressure 102/62, pulse (!) 54, height 5\' 5"  (1.651 m), weight 125 lb (56.7 kg), SpO2 99 %.  GEN:  Well nourished, well developed in no acute distress HEENT: Normal NECK: No JVD; No carotid bruits LYMPHATICS: No lymphadenopathy CARDIAC: RRR ,  no murmurs, rubs, gallops RESPIRATORY:  Clear to auscultation without rales, wheezing or rhonchi  ABDOMEN: Soft, non-tender, non-distended MUSCULOSKELETAL:  No edema; No deformity  SKIN: Warm and dry NEUROLOGIC:  Alert and oriented x 3   EKG:  EKG is ordered today.  Sinus brady at 56.  Normal.    Recent Labs: No results found for requested labs within last 8760 hours.    Lipid Panel No results found for: CHOL, TRIG, HDL, CHOLHDL, VLDL, LDLCALC, LDLDIRECT    Wt Readings from Last 3 Encounters:  10/03/16 125 lb (56.7 kg)  02/21/16 127 lb 3.2 oz (57.7 kg)  01/31/16 129 lb (58.5 kg)      Other studies Reviewed: Additional studies/ records that were reviewed today include: 30 day event  monitor. Review of the above records demonstrates: paroxysmal atrial fib   ASSESSMENT AND PLAN:  1.  Junctional tachycardia:  This seems to be doing well. She's not had any further episodes of tachycardia. She has occasional palpitations that are probably premature ventricular contractions. We discussed the Valsalva maneuver the past.  I've discussed the Kardia  monitor by [employer].   Demonstrated it    2.   Paroxysmal atrial fib: She's not had any recurrent episodes of paroxysmal atrial fibrillation.Marland Kitchen   Her CHADS 2 VASC score is 0.   I do not think that she needs any adequate lesion. Continue  a baby aspirin once a day.  3. Family history of cardiac disease. Her father dropped dead at age 74 on the past couple or. It's likely that he had a myocardial infarction. Her brother has a history of heart disease. She would like to have an LPa drawn. We'll get a lipomed  profile along with and LPa , BMP , TSH, CBC LFTs  Current medicines are reviewed at length with the patient today.  The patient does not have concerns regarding medicines.  The following changes have been made:  no change  Labs/ tests ordered today include:   No orders of the defined types were placed in this encounter.  Disposition:   FU with me  In 1 year    Signed, Kristeen Miss, MD  10/03/2016 9:17 AM    [facility] [address] [place], [place], [place]  [address] Phone: [phone]; Fax: [phone]

## 2016-10-03 NOTE — Patient Instructions (Signed)
Medication Instructions:  Your physician recommends that you continue on your current medications as directed. Please refer to the Current Medication list given to you today.   Labwork: TODAY - Lipids with Lipoprofile, TSH, CBC, Liver panel, basic metabolic panel   Testing/Procedures: None Ordered   Follow-Up: Your physician wants you to follow-up in: 1 year with Dr. Elease Hashimoto.  You will receive a reminder letter in the mail two months in advance. If you don't receive a letter, please call our office to schedule the follow-up appointment.   If you need a refill on your cardiac medications before your next appointment, please call your pharmacy.   Thank you for choosing [facility]! Eligha Bridegroom, RN [phone]

## 2016-10-04 LAB — HEPATIC FUNCTION PANEL
ALBUMIN: 4.4 g/dL (ref 3.5–5.5)
ALT: 19 IU/L (ref 0–32)
AST: 23 IU/L (ref 0–40)
Alkaline Phosphatase: 56 IU/L (ref 39–117)
BILIRUBIN TOTAL: 0.6 mg/dL (ref 0.0–1.2)
Bilirubin, Direct: 0.18 mg/dL (ref 0.00–0.40)
Total Protein: 6.2 g/dL (ref 6.0–8.5)

## 2016-10-04 LAB — BASIC METABOLIC PANEL
BUN / CREAT RATIO: 13 (ref 9–23)
BUN: 12 mg/dL (ref 6–24)
CHLORIDE: 102 mmol/L (ref 96–106)
CO2: 25 mmol/L (ref 20–29)
Calcium: 9.4 mg/dL (ref 8.7–10.2)
Creatinine, Ser: 0.89 mg/dL (ref 0.57–1.00)
GFR, EST AFRICAN AMERICAN: 86 mL/min/{1.73_m2} (ref >59)
GFR, EST NON AFRICAN AMERICAN: 75 mL/min/{1.73_m2} (ref >59)
Glucose: 78 mg/dL (ref 65–99)
POTASSIUM: 4.2 mmol/L (ref 3.5–5.2)
SODIUM: 142 mmol/L (ref 134–144)

## 2016-10-04 LAB — CBC
Hematocrit: 36.5 % (ref 34.0–46.6)
Hemoglobin: 11.9 g/dL (ref 11.1–15.9)
MCH: 28.5 pg (ref 26.6–33.0)
MCHC: 32.6 g/dL (ref 31.5–35.7)
MCV: 88 fL (ref 79–97)
Platelets: 215 10*3/uL (ref 150–379)
RBC: 4.17 x10E6/uL (ref 3.77–5.28)
RDW: 13.7 % (ref 12.3–15.4)
WBC: 4.2 10*3/uL (ref 3.4–10.8)

## 2016-10-04 LAB — NMR LIPOPROF + GRAPH
Cholesterol: 167 mg/dL (ref 100–199)
HDL CHOLESTEROL BY NMR: 69 mg/dL (ref >39)
HDL Particle Number: 34.9 umol/L (ref >=30.5)
LDL PARTICLE NUMBER: 914 nmol/L (ref <1000)
LDL Size: 21.2 nm (ref >20.5)
LDL-C: 88 mg/dL (ref 0–99)
LP-IR Score: 25 (ref <=45)
SMALL LDL PARTICLE NUMBER: 294 nmol/L (ref <=527)
Triglycerides by NMR: 51 mg/dL (ref 0–149)

## 2016-10-04 LAB — LIPOPROTEIN A (LPA): LIPOPROTEIN (A): 182 nmol/L — AB (ref <75)

## 2016-10-04 LAB — APOLIPOPROTEIN B: Apolipoprotein B: 91 mg/dL (ref 54–133)

## 2016-10-04 LAB — TSH: TSH: 1.06 u[IU]/mL (ref 0.450–4.500)

## 2016-10-05 ENCOUNTER — Telehealth: Payer: Self-pay | Admitting: Nurse Practitioner

## 2016-10-05 DIAGNOSIS — Z8249 Family history of ischemic heart disease and other diseases of the circulatory system: Secondary | ICD-10-CM

## 2016-10-05 DIAGNOSIS — E7841 Elevated Lipoprotein(a): Secondary | ICD-10-CM

## 2016-10-05 MED ORDER — ROSUVASTATIN CALCIUM 5 MG PO TABS: 5.0000 mg | ORAL_TABLET | Freq: Every day | ORAL | 3 refills | Status: DC

## 2016-10-05 NOTE — Telephone Encounter (Signed)
Results and plan of care reviewed with patient by Dr. Elease Hashimoto via telephone. Calcium score order placed and message sent to scheduling. Rosuvastatin 5 mg QD Rx sent to patient's pharmacy. Per Dr. Elease Hashimoto, we will see her back in 3 months approximately 1 week after lab work.

## 2016-10-05 NOTE — Telephone Encounter (Signed)
-----   Message from [name], MD sent at 10/05/2016  8:14 AM EDT ----- Elevated lipoprotein a. Her other lipids look okay. We would like to lower her LDL cluster oh further. Start rosuvastatin 5 mg a day. [facility] [address] .  coronary calcium score We'll recheck a lipomed profile and Lipoprotein a , LFTs in 3 months with office visit 1 week after these labs

## 2016-10-10 ENCOUNTER — Ambulatory Visit (INDEPENDENT_AMBULATORY_CARE_PROVIDER_SITE_OTHER)
Admission: RE | Admit: 2016-10-10 | Discharge: 2016-10-10 | Disposition: A | Discharge status: Home / Self Care | Payer: Self-pay | Source: Ambulatory Visit | Attending: Cardiovascular Disease | Admitting: Cardiovascular Disease

## 2016-10-10 DIAGNOSIS — Z8249 Family history of ischemic heart disease and other diseases of the circulatory system: Secondary | ICD-10-CM

## 2016-10-10 DIAGNOSIS — E7841 Elevated Lipoprotein(a): Secondary | ICD-10-CM

## 2016-12-13 ENCOUNTER — Telehealth: Payer: Self-pay | Admitting: Cardiovascular Disease

## 2016-12-13 NOTE — Telephone Encounter (Signed)
°  New Prob  Pt calling to verify she needs to be seen for upcoming follow up. Please call.

## 2016-12-14 NOTE — Telephone Encounter (Signed)
Spoke with patient about her upcoming appointment and to ask is she is having any chest pain or other concerns. She states she has not had any chest pain recently and no episodes of heart racing. She states she does not feel that the appointment on 12/18 is necessary. She would like to keep the appointment for the lab work on 12/11 and is aware we will call her to discuss the results. I advised that we can schedule follow-up for later in the year. She verbalized understanding and agreement and thanked me for the call.

## 2016-12-18 ENCOUNTER — Other Ambulatory Visit: Payer: BLUE CROSS/BLUE SHIELD

## 2016-12-25 ENCOUNTER — Ambulatory Visit: Payer: BLUE CROSS/BLUE SHIELD | Admitting: Cardiovascular Disease

## 2016-12-26 ENCOUNTER — Other Ambulatory Visit: Payer: BLUE CROSS/BLUE SHIELD

## 2016-12-26 DIAGNOSIS — E7841 Elevated Lipoprotein(a): Secondary | ICD-10-CM

## 2016-12-26 DIAGNOSIS — Z8249 Family history of ischemic heart disease and other diseases of the circulatory system: Secondary | ICD-10-CM

## 2016-12-27 LAB — APOLIPOPROTEIN B: Apolipoprotein B: 68 mg/dL (ref 54–133)

## 2016-12-27 LAB — HEPATIC FUNCTION PANEL
ALBUMIN: 4.5 g/dL (ref 3.5–5.5)
ALK PHOS: 58 IU/L (ref 39–117)
ALT: 23 IU/L (ref 0–32)
AST: 22 IU/L (ref 0–40)
BILIRUBIN TOTAL: 0.5 mg/dL (ref 0.0–1.2)
BILIRUBIN, DIRECT: 0.18 mg/dL (ref 0.00–0.40)
Total Protein: 6.3 g/dL (ref 6.0–8.5)

## 2016-12-27 LAB — LIPOPROTEIN A (LPA): Lipoprotein (a): 201 nmol/L — ABNORMAL HIGH (ref <75)

## 2016-12-27 LAB — NMR LIPOPROF + GRAPH
CHOLESTEROL: 144 mg/dL (ref 100–199)
HDL CHOLESTEROL BY NMR: 68 mg/dL (ref >39)
HDL PARTICLE NUMBER: 35 umol/L (ref >=30.5)
LDL Particle Number: 566 nmol/L (ref <1000)
LDL Size: 21.1 nm (ref >20.5)
LDL-C: 66 mg/dL (ref 0–99)
LP-IR Score: 32 (ref <=45)
Small LDL Particle Number: <90 nmol/L (ref <=527)
TRIGLYCERIDES BY NMR: 48 mg/dL (ref 0–149)

## 2017-10-29 ENCOUNTER — Ambulatory Visit: Payer: BLUE CROSS/BLUE SHIELD | Admitting: Cardiovascular Disease

## 2017-11-22 ENCOUNTER — Encounter: Payer: Self-pay | Admitting: Cardiovascular Disease

## 2017-11-22 ENCOUNTER — Ambulatory Visit (INDEPENDENT_AMBULATORY_CARE_PROVIDER_SITE_OTHER): Payer: Managed Care, Other (non HMO) | Admitting: Cardiovascular Disease

## 2017-11-22 VITALS — BP 114/82 | HR 53 | Ht 65.0 in | Wt 127.0 lb

## 2017-11-22 DIAGNOSIS — I480 Paroxysmal atrial fibrillation: Secondary | ICD-10-CM | POA: Diagnosis not present

## 2017-11-22 NOTE — Progress Notes (Signed)
Cardiology Office Note   Date:  11/22/2017   ID:  Gabriella Grimes, DOB 1965/02/20, MRN [id number]  PCP:  Creola Corn, MD  Cardiologist:   Kristeen Miss, MD   Chief Complaint  Patient presents with  . Tachycardia      Previous notes.  Gabriella Grimes is a 52 y.o. female who presents for follow up of her paroxysmal atrial fib.    has had intermittent palpitations and atypical chest pain for the past several years. I saw her during a house visit on December 24. She was having heartbeat irregularities. . Clinically the rhythm felt like atrial fibrillation. She was having some chest discomfort but her rhythm was completely normal. We placed an event monitor.  Event monitor reveals predominantly normal sinus rhythm. She has frequent premature atrial contractions , occasional PVCs. She has also had some runs of paroxysmal atrial fibrillation.  She remains active.  She hurt her back so she has not been as active as she normally is. Runs on occasion.    Jan. 23, 2018:  Doing well. Thinks she has had an episode of SVT and another episode of PAF .   Was playing tennis at the time.   Felt a little faint but played through the episode.  Associated with a stressful time  Still running some.   Does the elllptical and stair climber regularly .     Feb. 13, 2018:  [name] Is seen today as a work in visit. She started having episodes of tachycardia while playing tennis. Her rate was around 110. She was brought in for an EKG and further evaluation.  Her EKG revealed junctional tachycardia at a rate of 110. She had retrograde P waves. She was hemodynamically very stable.  Sept. 26, 2018:    No tachycardia .  Occasional short burst ( several seconds)  Seems to be more stress  Rare episodes of CP - not related to activity  Wants to have LPa drawn .    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  A coronary calcium score is 1.  We started her on low-dose rosuvastatin.   She stopped the Rosuvastatin because of some eyelid drooping  ( found an article related to rosuvastatin and lip drooping   Past Medical History:  Diagnosis Date  . Erythema nodosum   . Heart murmur    MVP  . Seasonal allergies     Past Surgical History:  Procedure Laterality Date  . CHOLECYSTECTOMY  1995     Current Outpatient Medications  Medication Sig Dispense Refill  . aspirin EC 81 MG tablet Take 1 tablet (81 mg total) by mouth daily.    Marland Kitchen glucosamine-chondroitin 500-400 MG tablet Take 2 tablets by mouth every morning.     No current facility-administered medications for this visit.     Allergies:   Patient has no known allergies.    Social History:  The patient  reports that she has never smoked. She has never used smokeless tobacco. She reports that she drinks alcohol. She reports that she does not use drugs.   Family History:  The patient's family history includes Arrhythmia in her mother; Diabetes in her mother; Heart attack in her father.    ROS:  Please see the history of present illness.   Otherwise, review of  systems are positive for occasional back pains.  Occasional typical CP .   All other systems are reviewed and negative.   Physical Exam: Blood pressure 114/82, pulse (!) 53, height 5\' 5"  (1.651 m), weight 127 lb (57.6 kg), SpO2 95 %.  GEN:  Well nourished, well developed in no acute distress HEENT: Normal NECK: No JVD; No carotid bruits LYMPHATICS: No lymphadenopathy CARDIAC: RRR , no murmurs, rubs, gallops RESPIRATORY:  Clear to auscultation without rales, wheezing or rhonchi  ABDOMEN: Soft, non-tender, non-distended MUSCULOSKELETAL:  No edema; No deformity  SKIN: Warm and dry NEUROLOGIC:  Alert and oriented x 3   EKG:    November 22, 2017: Sinus bradycardia 53 beats a minute.  Normal EKG.   Recent  Labs: 12/26/2016: ALT 23    Lipid Panel    Component Value Date/Time   CHOL 144 12/26/2016 0910   TRIG 48 12/26/2016 0910   HDL 68 12/26/2016 0910      Wt Readings from Last 3 Encounters:  11/22/17 127 lb (57.6 kg)  10/03/16 125 lb (56.7 kg)  02/21/16 127 lb 3.2 oz (57.7 kg)      Other studies Reviewed: Additional studies/ records that were reviewed today include: 30 day event monitor. Review of the above records demonstrates: paroxysmal atrial fib   ASSESSMENT AND PLAN:  1.  Junctional tachycardia:     2.   Paroxysmal atrial fib:    Her CHADS 2 VASC score is 0.   I do not think that she needs anticoagulation Not had any recent episodes of paroxysmal atrial fibrillation.  Continue to watch.   3. Family history of cardiac disease.   She has a family history of coronary artery disease.  Her father dropped dead at age 94.  Her coronary calcium score is 1.  Her lipid med profile overall looks fairly good.  We tried her on low-dose rosuvastatin but it seemed to cause some double vision and eyelid drooping.  For now we will hold off on a statin.  We will continue to follow.  Current medicines are reviewed at length with the patient today.  The patient does not have concerns regarding medicines.  The following changes have been made:  no change  Labs/ tests ordered today include:   No orders of the defined types were placed in this encounter.  Disposition:   FU with me  In 1 year    Signed, Kristeen Miss, MD  11/22/2017 3:18 PM    [facility] [address] [place], [place], [place]  [address] Phone: [phone]; Fax: [phone]

## 2017-11-22 NOTE — Patient Instructions (Signed)

## 2018-02-26 ENCOUNTER — Ambulatory Visit (INDEPENDENT_AMBULATORY_CARE_PROVIDER_SITE_OTHER): Payer: Managed Care, Other (non HMO) | Admitting: Orthopedic Surgery

## 2018-02-26 ENCOUNTER — Ambulatory Visit (INDEPENDENT_AMBULATORY_CARE_PROVIDER_SITE_OTHER): Payer: Managed Care, Other (non HMO)

## 2018-02-26 ENCOUNTER — Encounter (INDEPENDENT_AMBULATORY_CARE_PROVIDER_SITE_OTHER): Payer: Self-pay | Admitting: Orthopedic Surgery

## 2018-02-26 DIAGNOSIS — M542 Cervicalgia: Secondary | ICD-10-CM

## 2018-02-26 MED ORDER — DICLOFENAC SODIUM 75 MG PO TBEC: DELAYED_RELEASE_TABLET | ORAL | 0 refills | Status: DC

## 2018-03-01 ENCOUNTER — Encounter (INDEPENDENT_AMBULATORY_CARE_PROVIDER_SITE_OTHER): Payer: Self-pay | Admitting: Orthopedic Surgery
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
## 2018-11-27 NOTE — Progress Notes (Signed)
Cardiology Office Note   Date:  12/02/2018   ID:  Gabriella Grimes, Gabriella Grimes Aug 03, 1965, MRN [id number]  PCP:  Gabriella Corn, MD  Cardiologist:   Gabriella Miss, MD   Chief Complaint  Patient presents with  . Atrial Fibrillation      Previous notes.  Gabriella Grimes Grimes a 53 y.o. female who presents for follow up of her paroxysmal atrial fib.    has had intermittent palpitations and atypical chest pain for the past several years. I saw her during a house visit on December 24. She was having heartbeat irregularities. . Clinically the rhythm felt like atrial fibrillation. She was having some chest discomfort but her rhythm was completely normal. We placed an event monitor.  Event monitor reveals predominantly normal sinus rhythm. She has frequent premature atrial contractions , occasional PVCs. She has also had some runs of paroxysmal atrial fibrillation.  She remains active.  She hurt her back so she has not been as active as she normally Grimes. Runs on occasion.    Jan. 23, 2018:  Doing well. Thinks she has had an episode of SVT and another episode of PAF .   Was playing tennis at the time.   Felt a little faint but played through the episode.  Associated with a stressful time  Still running some.   Does the elllptical and stair climber regularly .     Feb. 13, 2018:  Gabriella Grimes seen today as a work in visit. She started having episodes of tachycardia while playing tennis. Her rate was around 110. She was brought in for an EKG and further evaluation.  Her EKG revealed junctional tachycardia at a rate of 110. She had retrograde P waves. She was hemodynamically very stable.  Sept. 26, 2018:    No tachycardia .  Occasional short burst ( several seconds)  Seems to be more stress  Rare episodes of CP - not related to activity  Wants to have LPa drawn .    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  A coronary calcium score Grimes 1.  We started her on low-dose rosuvastatin.   She stopped the Rosuvastatin because of some eyelid drooping  ( found an article related to rosuvastatin and lip drooping   Nov. 24, 2020:  Gabriella Grimes Grimes seen today for follow up of her PAF and hyperlipidemia .  Her coronary calcium score was 1 in 2018. Doing well .  Has occasional palpitations. Had some palps while driving to work the other day  HR was ~ 105.   She thought it felt regular  Playing tennis regulalry .   Strained her back last week   Has elevated LPa LDL, HDL , Trigs look great  Has tried statins in the past.   Had some double visioin and lid droop and she stopped it .     I've discussed with Gabriella Grimes the issue of an elevated LPa in the setting of normal chol levels. At present the only agents that lower LPa are the PCSK-9 inhibitors and she likely Grimes not a candidate since her chol levels are so good. There may be some agents available to specifically lower LPa in the future but not available now.  Past Medical History:  Diagnosis Date  . Erythema nodosum   . Heart murmur  MVP  . Seasonal allergies     Past Surgical History:  Procedure Laterality Date  . CHOLECYSTECTOMY  1995     Current Outpatient Medications  Medication Sig Dispense Refill  . aspirin EC 81 MG tablet Take 1 tablet (81 mg total) by mouth daily.    . Cyclobenzaprine HCl (FLEXERIL PO) Take 0.5 tablets by mouth daily.    . diclofenac (VOLTAREN) 75 MG EC tablet 1 po bid x 10 days then 1 po q d x 10 days then qd prn 60 tablet 0  . glucosamine-chondroitin 500-400 MG tablet Take 2 tablets by mouth every morning.    Marland Kitchen levonorgestrel (MIRENA, 52 MG,) 20 MCG/24HR IUD Mirena 20 mcg/24 hours (6 yrs) 52 mg intrauterine device     No current facility-administered medications for this visit.     Allergies:   Patient has no known allergies.    Social History:  The  patient  reports that she has never smoked. She has never used smokeless tobacco. She reports current alcohol use. She reports that she does not use drugs.   Family History:  The patient's family history includes Arrhythmia in her mother; Diabetes in her mother; Heart attack in her father.    ROS:  Please see the history of present illness.   Otherwise, review of systems are positive for occasional back pains.  Occasional typical CP .   All other systems are reviewed and negative.   Physical Exam: Blood pressure 110/68, pulse 63, height 5\' 5"  (1.651 m), weight 137 lb 12.8 oz (62.5 kg), SpO2 99 %.  GEN:  Well nourished, well developed in no acute distress HEENT: Normal NECK: No JVD; No carotid bruits LYMPHATICS: No lymphadenopathy CARDIAC: RRR , no murmurs, rubs, gallops RESPIRATORY:  Clear to auscultation without rales, wheezing or rhonchi  ABDOMEN: Soft, non-tender, non-distended MUSCULOSKELETAL:  No edema; No deformity  SKIN: Warm and dry NEUROLOGIC:  Alert and oriented x 3   EKG:    December 02, 2018: Normal sinus rhythm at 63.  Normal sinus rhythm.  Recent Labs: No results found for requested labs within last 8760 hours.    Lipid Panel    Component Value Date/Time   CHOL 144 12/26/2016 0910   TRIG 48 12/26/2016 0910   HDL 68 12/26/2016 0910      Wt Readings from Last 3 Encounters:  12/02/18 137 lb 12.8 oz (62.5 kg)  11/22/17 127 lb (57.6 kg)  10/03/16 125 lb (56.7 kg)      Other studies Reviewed: Additional studies/ records that were reviewed today include: 30 day event monitor. Review of the above records demonstrates: paroxysmal atrial fib   ASSESSMENT AND PLAN:   1.      Paroxysmal atrial fib:   Continues to have intermittant palpitations .  Her CHADS 2 VASC score Grimes 0.    2.  Elevated LPa:   I've discussed with Gabriella Grimes the issue of an elevated LPa in the setting of normal chol levels. At present the only agents that lower LPa are the PCSK-9 inhibitors  and she likely Grimes not a candidate since her chol levels are so good. There may be some agents available to specifically lower LPa in the future but not available now.   Will continue to follow  NMR lipid panel today .      3. Family history of cardiac disease.   She has a family history of coronary artery disease.  Her father dropped dead at age 44.  Her coronary  calcium score Grimes 1.    Current medicines are reviewed at length with the patient today.  The patient does not have concerns regarding medicines.  The following changes have been made:  no change  Labs/ tests ordered today include:   Orders Placed This Encounter  Procedures  . NMR, lipoprofile  . Basic Metabolic Panel (BMET)  . EKG 12-Lead   Disposition:   FU with me  In 1 year    Signed, Gabriella Miss, MD  12/02/2018 1:21 PM    [facility] [address] [place], [place], [place]  [address] Phone: [phone]; Fax: [phone]

## 2018-12-02 ENCOUNTER — Encounter: Payer: Self-pay | Admitting: Cardiovascular Disease

## 2018-12-02 ENCOUNTER — Ambulatory Visit (INDEPENDENT_AMBULATORY_CARE_PROVIDER_SITE_OTHER): Payer: Managed Care, Other (non HMO) | Admitting: Cardiovascular Disease

## 2018-12-02 ENCOUNTER — Other Ambulatory Visit: Payer: Self-pay

## 2018-12-02 VITALS — BP 110/68 | HR 63 | Ht 65.0 in | Wt 137.8 lb

## 2018-12-02 DIAGNOSIS — I480 Paroxysmal atrial fibrillation: Secondary | ICD-10-CM | POA: Diagnosis not present

## 2018-12-02 DIAGNOSIS — E7841 Elevated Lipoprotein(a): Secondary | ICD-10-CM | POA: Diagnosis not present

## 2018-12-02 NOTE — Patient Instructions (Signed)
Medication Instructions:  Your provider recommends that you continue on your current medications as directed. Please refer to the Current Medication list given to you today.   *If you need a refill on your cardiac medications before your next appointment, please call your pharmacy*  Lab Work: TODAY: NMR, BMET If you have labs (blood work) drawn today and your tests are completely normal, you will receive your results only by: Marland Kitchen MyChart Message (if you have MyChart) OR . A paper copy in the mail If you have any lab test that is abnormal or we need to change your treatment, we will call you to review the results.  Follow-Up: At [facility], you and your health needs are our priority.  As part of our continuing mission to provide you with exceptional heart care, we have created designated Provider Care Teams.  These Care Teams include your primary Cardiologist (physician) and Advanced Practice Providers (APPs -  Physician Assistants and Nurse Practitioners) who all work together to provide you with the care you need, when you need it. Your next appointment:   12 month(s) The format for your next appointment:   In Person Provider:   You may see Mertie Moores, MD or one of the following Advanced Practice Providers on your designated Care Team:    Richardson Dopp, PA-C  [place], [place]  Daune Perch, [place]

## 2018-12-04 LAB — BASIC METABOLIC PANEL
BUN/Creatinine Ratio: 17 (ref 9–23)
BUN: 16 mg/dL (ref 6–24)
CO2: 25 mmol/L (ref 20–29)
Calcium: 9.2 mg/dL (ref 8.7–10.2)
Chloride: 103 mmol/L (ref 96–106)
Creatinine, Ser: 0.96 mg/dL (ref 0.57–1.00)
GFR calc Af Amer: 78 mL/min/{1.73_m2} (ref >59)
GFR calc non Af Amer: 67 mL/min/{1.73_m2} (ref >59)
Glucose: 82 mg/dL (ref 65–99)
Potassium: 4.7 mmol/L (ref 3.5–5.2)
Sodium: 142 mmol/L (ref 134–144)

## 2018-12-04 LAB — NMR, LIPOPROFILE
Cholesterol, Total: 202 mg/dL — ABNORMAL HIGH (ref 100–199)
HDL Particle Number: 35.4 umol/L (ref >=30.5)
HDL-C: 67 mg/dL (ref >39)
LDL Particle Number: 1216 nmol/L — ABNORMAL HIGH (ref <1000)
LDL Size: 21.5 nm (ref >20.5)
LDL-C (NIH Calc): 124 mg/dL — ABNORMAL HIGH (ref 0–99)
LP-IR Score: 30 (ref <=45)
Small LDL Particle Number: 244 nmol/L (ref <=527)
Triglycerides: 63 mg/dL (ref 0–149)

## 2018-12-08 ENCOUNTER — Telehealth: Payer: Self-pay | Admitting: Cardiovascular Disease

## 2018-12-08 DIAGNOSIS — E7841 Elevated Lipoprotein(a): Secondary | ICD-10-CM

## 2018-12-08 DIAGNOSIS — E7800 Pure hypercholesterolemia, unspecified: Secondary | ICD-10-CM

## 2018-12-08 NOTE — Telephone Encounter (Signed)
Spoke with patient and reviewed Dr. Elmarie Shiley advice. Patient agrees to come in for Lipid clinic appointment so that she can make an informed decision regarding treatment. Questions were answered to her satisfaction. She is scheduled for Lipid Clinic appointment on 12/15. She thanked me for the call.

## 2018-12-08 NOTE — Telephone Encounter (Signed)
New Message  Pt is returning call about results  Please call

## 2018-12-08 NOTE — Telephone Encounter (Signed)
Left detailed message regarding Dr. Elmarie Shiley plan for referral to Lipid clinic. I advised her to call back or send a message through MyChart to agree or for further discussion

## 2018-12-08 NOTE — Telephone Encounter (Signed)
Left message apologizing for missing the call and that I will call her back later this afternoon

## 2018-12-23 ENCOUNTER — Other Ambulatory Visit: Payer: Self-pay

## 2018-12-23 ENCOUNTER — Ambulatory Visit (INDEPENDENT_AMBULATORY_CARE_PROVIDER_SITE_OTHER): Payer: 59 | Admitting: Pharmacist

## 2018-12-23 ENCOUNTER — Encounter: Payer: Self-pay | Admitting: Pharmacist

## 2018-12-23 DIAGNOSIS — E7841 Elevated Lipoprotein(a): Secondary | ICD-10-CM | POA: Diagnosis not present

## 2018-12-23 DIAGNOSIS — Z13220 Encounter for screening for lipoid disorders: Secondary | ICD-10-CM | POA: Diagnosis not present

## 2018-12-23 DIAGNOSIS — E7800 Pure hypercholesterolemia, unspecified: Secondary | ICD-10-CM | POA: Diagnosis not present

## 2018-12-23 MED ORDER — ROSUVASTATIN CALCIUM 20 MG PO TABS: 20.0000 mg | ORAL_TABLET | Freq: Every day | ORAL | 3 refills | Status: DC

## 2018-12-23 NOTE — Patient Instructions (Addendum)
It was a pleasure to meet you today!  Start taking rosuvastatin 20mg  daily  Recommend reading: Eat, sleep, move, breath: a beginners guide to living a healthy lifestyle  Give [place] a call at [phone] with any questions or concerns.

## 2018-12-23 NOTE — Progress Notes (Signed)
Patient ID: HERMENIA Grimes                 DOB: 11/17/1965                    MRN: [id number]     HPI: Gabriella Grimes is a 53 y.o. female patient referred to lipid clinic by Dr. Acie Fredrickson. PMH is significant for afib, HLD, coronary calcium score of 1 in 2018, elevated LPa, family history of premature coronary artery disease (fatal heart attack at 108).  Patient presents today to discuss lipid treatment. Patient is very active. She states that she is not as physically fit as she has been and is trying to get back to better shape.  She is not sure the Crestor caused her eyelid droop and double vision as it has occurred a few other times while playing tennis, after she stopped taking the Crestor.  Current Medications: none Intolerances: rosuvastatin (eyelid drooping, double vision) Risk Factors: coronary calcium score 1, elevated Lpa, elevated LDL particle # LDL goal: <70  Diet: does not eat much during the day-banana and protein bar- then eats a bigger dinner. Loves salmon   Exercise: tennis, running/walking, weight training (was doing a boot camp)  Family History:  The patient's family history includes Arrhythmia in her mother; Diabetes in her mother; Heart attack in her father. (age 101)  Social History: The patient  reports that she has never smoked. She has never used smokeless tobacco. She reports current alcohol use. She reports that she does not use drugs.   Labs: 12/02/2018 TC 202 TG 63 HDL 67 LDL 124 LDL particle # 1216 12/26/2016 LPa 201  Past Medical History:  Diagnosis Date  . Erythema nodosum   . Heart murmur    MVP  . Seasonal allergies     Current Outpatient Medications on File Prior to Visit  Medication Sig Dispense Refill  . aspirin EC 81 MG tablet Take 1 tablet (81 mg total) by mouth daily.    . Cyclobenzaprine HCl (FLEXERIL PO) Take 0.5 tablets by mouth daily.    . diclofenac (VOLTAREN) 75 MG EC tablet 1 po bid x 10 days then 1 po q d x 10 days then qd prn 60 tablet 0    . glucosamine-chondroitin 500-400 MG tablet Take 2 tablets by mouth every morning.    Marland Kitchen levonorgestrel (MIRENA, 52 MG,) 20 MCG/24HR IUD Mirena 20 mcg/24 hours (6 yrs) 52 mg intrauterine device     No current facility-administered medications on file prior to visit.    No Known Allergies  Assessment/Plan:  1. Hyperlipidemia - Patient LDL is above goal of <70. She does not have an intolerance to rosuvastatin as her double vision has occurred off medication as well. Patient is willing to retry rosuvastatin. Start rosuvastatin 20mg  daily. We did discuss how statins do not lower LPa, but that they do have very good evidence for lowering our cardiovascular risk and are important for a foundation of cholesterol management. May be challening to get PCSK9i approved if LDL drops below 70 with statin, but if LPa starts to trend up, we can attempt to get approved. Recheck lipids including a LPa in 10 weeks.  Also discussed diets and exercise. Discussed several different types of diets and the pros and cons of each. Recommended reading eat, sleep, move, breath as a starting point. Advised more of a mediterranean diet.    Thank you,  Ramond Dial, Pharm.D, BCPS, [facility]  [address] [place], [place] [address]  Phone: [phone]; Fax: [phone]

## 2019-03-03 ENCOUNTER — Other Ambulatory Visit: Payer: 59

## 2019-03-03 ENCOUNTER — Other Ambulatory Visit: Payer: Self-pay

## 2019-03-03 DIAGNOSIS — E7800 Pure hypercholesterolemia, unspecified: Secondary | ICD-10-CM

## 2019-03-03 DIAGNOSIS — Z13220 Encounter for screening for lipoid disorders: Secondary | ICD-10-CM

## 2019-03-04 LAB — LIPID PANEL
Chol/HDL Ratio: 1.9 ratio (ref 0.0–4.4)
Cholesterol, Total: 141 mg/dL (ref 100–199)
HDL: 73 mg/dL (ref >39)
LDL Chol Calc (NIH): 57 mg/dL (ref 0–99)
Triglycerides: 47 mg/dL (ref 0–149)
VLDL Cholesterol Cal: 11 mg/dL (ref 5–40)

## 2019-03-04 LAB — LIPOPROTEIN A (LPA): Lipoprotein (a): 254.7 nmol/L — ABNORMAL HIGH (ref <75.0)

## 2019-03-05 ENCOUNTER — Telehealth: Payer: Self-pay | Admitting: Pharmacist

## 2019-03-05 DIAGNOSIS — Z13220 Encounter for screening for lipoid disorders: Secondary | ICD-10-CM

## 2019-03-05 MED ORDER — ROSUVASTATIN CALCIUM 40 MG PO TABS: 40.0000 mg | ORAL_TABLET | Freq: Every day | ORAL | 3 refills | Status: DC

## 2019-03-05 NOTE — Telephone Encounter (Signed)
Spoke to patient about her lab results. Have decided to increase rosuvastatin to 40mg  daily for max benefit. Patient will call if she has any issues.  Repeat lipid panel including particle # in 3 months. No need to repeat Lpa as she is not on any medications that might lower it.

## 2019-03-05 NOTE — Telephone Encounter (Signed)
Called patient to review lab results. LDL is great at 57. LPa is elevated. This is a genetic marker and is not lowered by statins. PCSK9i do lower it, but I doubt insurance will cover with an LDL of 57. Statins do not lower LPa but can decrease the atherogenicity of LPa by lowering LDL. I think we could consider increasing rosuvastatin to 40mg  for the maximum benefit.  Left VM for patient to return call.

## 2019-03-05 NOTE — Addendum Note (Signed)
Addended by: Malena Peer D on: 03/05/2019 04:51 PM   Modules accepted: Orders

## 2019-04-22 ENCOUNTER — Ambulatory Visit (INDEPENDENT_AMBULATORY_CARE_PROVIDER_SITE_OTHER): Payer: 59 | Admitting: Podiatry

## 2019-04-22 ENCOUNTER — Other Ambulatory Visit: Payer: Self-pay

## 2019-04-22 ENCOUNTER — Encounter: Payer: Self-pay | Admitting: Podiatry

## 2019-04-22 ENCOUNTER — Ambulatory Visit (INDEPENDENT_AMBULATORY_CARE_PROVIDER_SITE_OTHER): Payer: 59

## 2019-04-22 DIAGNOSIS — R609 Edema, unspecified: Secondary | ICD-10-CM | POA: Diagnosis not present

## 2019-04-22 DIAGNOSIS — M21619 Bunion of unspecified foot: Secondary | ICD-10-CM | POA: Diagnosis not present

## 2019-04-22 DIAGNOSIS — B351 Tinea unguium: Secondary | ICD-10-CM | POA: Diagnosis not present

## 2019-04-22 NOTE — Progress Notes (Signed)
Subjective:   Patient ID: Gabriella Grimes, female   DOB: 54 y.o.   MRN: [id number]   HPI Patient presents stating that she is got some thick nails on her foot and she has swelling of her left foot which has been chronic and no one's been able to get her an answer.  Also complains of mild bunion deformity.  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       Objective:  Physical Exam Vitals and nursing note reviewed.  Constitutional:      Appearance: She is well-developed.  Pulmonary:     Effort: Pulmonary effort is normal.  Musculoskeletal:        General: Normal range of motion.  Skin:    General: Skin is warm.  Neurological:     Mental Status: She is alert.     Neurovascular status intact muscle strength was found to be adequate range of motion within normal limits.  I did note edema in the left forefoot midfoot and into the ankle nonpitting currently with negative Denna Haggard' sign noted and long-term history of problem.  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  I do not recommend treatment of nails except for keeping up thin and painted and trying to wear shoe gear of appropriate size  X-rays were negative for signs of fracture did reveal moderate bunion deformity left

## 2019-04-27 ENCOUNTER — Telehealth: Payer: Self-pay | Admitting: Pharmacist

## 2019-04-27 NOTE — Telephone Encounter (Signed)
Left VM for patient to call back to discuss cholesterol management- see my chart messages.

## 2019-05-01 NOTE — Telephone Encounter (Signed)
Spoke with patient about statins and double vision. I cannot guarantee that switching from one statin to another will not also cause double vision. There are several case reports of double vision with statins.  We could try to get PCSK9i approved. This would also lower her LPa.  Or we could try for Nexlizet, but there is no cardiovascular data yet. Patient eats healthy and exercises. She unfortunately has a genetic predisposition. Patient has not taken crestor in about a week due to tennis matches. She will think about her options and will get back to me.

## 2019-05-26 ENCOUNTER — Other Ambulatory Visit: Payer: Self-pay

## 2019-05-26 ENCOUNTER — Other Ambulatory Visit: Payer: 59

## 2019-05-26 DIAGNOSIS — Z13220 Encounter for screening for lipoid disorders: Secondary | ICD-10-CM

## 2019-05-27 LAB — SPECIMEN STATUS REPORT

## 2019-08-11 LAB — NMR, LIPOPROFILE
Cholesterol, Total: 200 mg/dL — ABNORMAL HIGH (ref 100–199)
HDL Particle Number: 28 umol/L — ABNORMAL LOW (ref >=30.5)
HDL-C: 66 mg/dL (ref >39)
LDL Particle Number: 1157 nmol/L — ABNORMAL HIGH (ref <1000)
LDL Size: 21.6 nm (ref >20.5)
LDL-C (NIH Calc): 121 mg/dL — ABNORMAL HIGH (ref 0–99)
LP-IR Score: <25 (ref <=45)
Small LDL Particle Number: <90 nmol/L (ref <=527)
Triglycerides: 74 mg/dL (ref 0–149)

## 2019-10-21 ENCOUNTER — Ambulatory Visit: Payer: Self-pay

## 2019-10-21 ENCOUNTER — Ambulatory Visit (INDEPENDENT_AMBULATORY_CARE_PROVIDER_SITE_OTHER): Payer: 59 | Admitting: Orthopedic Surgery

## 2019-10-21 ENCOUNTER — Encounter: Payer: Self-pay | Admitting: Orthopedic Surgery

## 2019-10-21 DIAGNOSIS — M542 Cervicalgia: Secondary | ICD-10-CM | POA: Diagnosis not present

## 2019-10-21 DIAGNOSIS — M25511 Pain in right shoulder: Secondary | ICD-10-CM | POA: Diagnosis not present

## 2019-10-21 MED ORDER — METHOCARBAMOL 500 MG PO TABS: 500.0000 mg | ORAL_TABLET | Freq: Three times a day (TID) | ORAL | 0 refills | Status: DC | PRN

## 2019-10-21 MED ORDER — PREDNISONE 5 MG (21) PO TBPK: ORAL_TABLET | ORAL | 0 refills | Status: DC
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
## 2020-01-15 ENCOUNTER — Encounter: Payer: Self-pay | Admitting: Cardiovascular Disease

## 2020-01-15 ENCOUNTER — Ambulatory Visit (INDEPENDENT_AMBULATORY_CARE_PROVIDER_SITE_OTHER): Payer: 59 | Admitting: Cardiovascular Disease

## 2020-01-15 ENCOUNTER — Other Ambulatory Visit: Payer: Self-pay

## 2020-01-15 VITALS — BP 112/80 | HR 60 | Ht 65.0 in | Wt 143.4 lb

## 2020-01-15 DIAGNOSIS — E7841 Elevated Lipoprotein(a): Secondary | ICD-10-CM | POA: Diagnosis not present

## 2020-01-15 DIAGNOSIS — I480 Paroxysmal atrial fibrillation: Secondary | ICD-10-CM

## 2020-01-15 NOTE — Patient Instructions (Signed)
Medication Instructions: No changes *If you need a refill on your cardiac medications before your next appointment, please call your pharmacy*   Lab Work: Today: NMR lipoprofile (w lipids), liver function, bmet  If you have labs (blood work) drawn today and your tests are completely normal, you will receive your results only by: Marland Kitchen MyChart Message (if you have MyChart) OR . A paper copy in the mail If you have any lab test that is abnormal or we need to change your treatment, we will call you to review the results.   Testing/Procedures: none   Follow-Up: At [facility], you and your health needs are our priority.  As part of our continuing mission to provide you with exceptional heart care, we have created designated Provider Care Teams.  These Care Teams include your primary Cardiologist (physician) and Advanced Practice Providers (APPs -  Physician Assistants and Nurse Practitioners) who all work together to provide you with the care you need, when you need it.   Your next appointment:   12 month(s)  The format for your next appointment:   In Person  Provider:   You may see Kristeen Miss, MD or one of the following Advanced Practice Providers on your designated Care Team:    Tereso Newcomer, PA-C  Chelsea Aus, [place]   Other Instructions

## 2020-01-15 NOTE — Progress Notes (Signed)
Cardiology Office Note   Date:  01/15/2020   ID:  Gabriella Grimes, DOB Apr 28, 1965, MRN [id number]  PCP:  Gabriella Corn, MD  Cardiologist:   Gabriella Miss, MD   Chief Complaint  Patient presents with  . Atrial Fibrillation      Previous notes.  Gabriella Grimes is a 55 y.o. female who presents for follow up of her paroxysmal atrial fib.    has had intermittent palpitations and atypical chest pain for the past several years. I saw her during a house visit on December 24. She was having heartbeat irregularities. . Clinically the rhythm felt like atrial fibrillation. She was having some chest discomfort but her rhythm was completely normal. We placed an event monitor.  Event monitor reveals predominantly normal sinus rhythm. She has frequent premature atrial contractions , occasional PVCs. She has also had some runs of paroxysmal atrial fibrillation.  She remains active.  She hurt her back so she has not been as active as she normally is. Runs on occasion.    Jan. 23, 2018:  Doing well. Thinks she has had an episode of SVT and another episode of PAF .   Was playing tennis at the time.   Felt a little faint but played through the episode.  Associated with a stressful time  Still running some.   Does the elllptical and stair climber regularly .     Feb. 13, 2018:  Gabriella Grimes Is seen today as a work in visit. She started having episodes of tachycardia while playing tennis. Her rate was around 110. She was brought in for an EKG and further evaluation.  Her EKG revealed junctional tachycardia at a rate of 110. She had retrograde P waves. She was hemodynamically very stable.  Sept. 26, 2018:    No tachycardia .  Occasional short burst ( several seconds)  Seems to be more stress  Rare episodes of CP - not related to activity  Wants to have LPa drawn .    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  A coronary calcium score is 1.  We started her on low-dose rosuvastatin.   She stopped the Rosuvastatin because of some eyelid drooping  ( found an article related to rosuvastatin and lip drooping   Nov. 24, 2020:  Viva is seen today for follow up of her PAF and hyperlipidemia .  Her coronary calcium score was 1 in 2018. Doing well .  Has occasional palpitations. Had some palps while driving to work the other day  HR was ~ 105.   She thought it felt regular  Playing tennis regulalry .   Strained her back last week   Has elevated LPa LDL, HDL , Trigs look great  Has tried statins in the past.   Had some double visioin and lid droop and she stopped it .     I've discussed with Dr. Rennis Golden the issue of an elevated LPa in the setting of normal chol levels. At present the only agents that lower LPa are the PCSK-9 inhibitors and she likely is not a candidate since her chol levels are so good. There may be some agents available to specifically lower LPa in the future but not available now.   Jan. 7, 2022: Gabriella Grimes is seen for follow up of her PAF and elevated  LPa  Has intermittant PAF , only lasts for a few seconds ( < 30 )  Has intermittant cp  No episodes of tachycardia .  She had junctional tachycardia in May, 2018.   Past Medical History:  Diagnosis Date  . Erythema nodosum   . Heart murmur    MVP  . Seasonal allergies     Past Surgical History:  Procedure Laterality Date  . CHOLECYSTECTOMY  1995     Current Outpatient Medications  Medication Sig Dispense Refill  . Calcium Carb-Cholecalciferol (CALCIUM 500+D) 500-200 MG-UNIT TABS Take 1 tablet by mouth daily in the afternoon.    . Cyanocobalamin (VITAMIN B-12 PO) Take 1 tablet by mouth daily in the afternoon.    Marland Kitchen glucosamine-chondroitin 500-400 MG tablet Take 2 tablets by mouth every morning.    . Multiple Vitamin (MULTI-VITAMIN PO) Take 1 tablet by mouth daily in  the afternoon.     No current facility-administered medications for this visit.    Allergies:   Patient has no known allergies.    Social History:  The patient  reports that she has never smoked. She has never used smokeless tobacco. She reports current alcohol use. She reports that she does not use drugs.   Family History:  The patient's family history includes Arrhythmia in her mother; Diabetes in her mother; Heart attack in her father.    ROS:  Please see the history of present illness.   Otherwise, review of systems are positive for occasional back pains.  Occasional typical CP .   All other systems are reviewed and negative.   Physical Exam: Blood pressure 112/80, pulse 60, height 5\' 5"  (1.651 m), weight 143 lb 6.4 oz (65 kg), SpO2 98 %.  GEN:  Well nourished, well developed in no acute distress HEENT: Normal NECK: No JVD; No carotid bruits LYMPHATICS: No lymphadenopathy CARDIAC: RRR , no murmurs, rubs, gallops,  She has some rib tenderness along a left rib toward her sternum.  RESPIRATORY:  Clear to auscultation without rales, wheezing or rhonchi  ABDOMEN: Soft, non-tender, non-distended MUSCULOSKELETAL:  No edema; No deformity  SKIN: Warm and dry NEUROLOGIC:  Alert and oriented x 3   EKG:     Normal sinus rhythm at 60.  No ST or T wave changes.  Recent Labs: No results found for requested labs within last 8760 hours.    Lipid Panel    Component Value Date/Time   CHOL 141 03/03/2019 1004   TRIG 47 03/03/2019 1004   TRIG 48 12/26/2016 0910   HDL 73 03/03/2019 1004   HDL 68 12/26/2016 0910   CHOLHDL 1.9 03/03/2019 1004   LDLCALC 57 03/03/2019 1004      Wt Readings from Last 3 Encounters:  01/15/20 143 lb 6.4 oz (65 kg)  12/02/18 137 lb 12.8 oz (62.5 kg)  11/22/17 127 lb (57.6 kg)      Other studies Reviewed: Additional studies/ records that were reviewed today include: 30 day event monitor. Review of the above records demonstrates: paroxysmal atrial  fib   ASSESSMENT AND PLAN:   1.      Paroxysmal atrial fib:   Continues to have intermittant palpitations .  Her CHADS 2 VASC score is 0.     2.  Elevated LPa:   I've discussed with Dr. 11/24/17 the issue of an elevated LPa in the setting of normal chol levels. At present the only agents that lower LPa are the PCSK-9 inhibitors and she likely is not a candidate since  her chol levels are so good. There may be some agents available to specifically lower LPa in the future but not available now.   Will continue to follow    She has had double vision with rosuvastatin and there is some concern that she will have a similar reaction to atorvastatin.  repeat NMR lipid panel today.  I discussed with our pharmacist whether or not bempedoic acid might be of some benefit.  Her LDL particle number has increased slightly.        3. Family history of cardiac disease.   She has a family history of coronary artery disease.  Her father dropped dead at age 11.  Her coronary calcium score is 1.    Current medicines are reviewed at length with the patient today.  The patient does not have concerns regarding medicines.  The following changes have been made:  no change  Labs/ tests ordered today include:   No orders of the defined types were placed in this encounter.  Disposition:   FU with me  In 1 year    Signed, Mertie Moores, MD  01/15/2020 1:38 PM    [facility] [place], [place], [place]  [address] Phone: [phone]; Fax: [phone]

## 2020-01-16 LAB — BASIC METABOLIC PANEL
BUN/Creatinine Ratio: 18 (ref 9–23)
BUN: 20 mg/dL (ref 6–24)
CO2: 26 mmol/L (ref 20–29)
Calcium: 9.7 mg/dL (ref 8.7–10.2)
Chloride: 102 mmol/L (ref 96–106)
Creatinine, Ser: 1.1 mg/dL — ABNORMAL HIGH (ref 0.57–1.00)
GFR calc Af Amer: 65 mL/min/{1.73_m2} (ref >59)
GFR calc non Af Amer: 57 mL/min/{1.73_m2} — ABNORMAL LOW (ref >59)
Glucose: 79 mg/dL (ref 65–99)
Potassium: 4.2 mmol/L (ref 3.5–5.2)
Sodium: 142 mmol/L (ref 134–144)

## 2020-01-16 LAB — NMR, LIPOPROFILE
Cholesterol, Total: 218 mg/dL — ABNORMAL HIGH (ref 100–199)
HDL Particle Number: 32.6 umol/L (ref >=30.5)
HDL-C: 74 mg/dL (ref >39)
LDL Particle Number: 1262 nmol/L — ABNORMAL HIGH (ref <1000)
LDL Size: 21.8 nm (ref >20.5)
LDL-C (NIH Calc): 135 mg/dL — ABNORMAL HIGH (ref 0–99)
LP-IR Score: <25 (ref <=45)
Small LDL Particle Number: 234 nmol/L (ref <=527)
Triglycerides: 50 mg/dL (ref 0–149)

## 2020-01-16 LAB — HEPATIC FUNCTION PANEL
ALT: 22 IU/L (ref 0–32)
AST: 28 IU/L (ref 0–40)
Albumin: 4.5 g/dL (ref 3.8–4.9)
Alkaline Phosphatase: 78 IU/L (ref 44–121)
Bilirubin Total: 0.6 mg/dL (ref 0.0–1.2)
Bilirubin, Direct: 0.15 mg/dL (ref 0.00–0.40)
Total Protein: 6.4 g/dL (ref 6.0–8.5)

## 2020-01-16 LAB — APOLIPOPROTEIN B: Apolipoprotein B: 107 mg/dL — ABNORMAL HIGH (ref <90)

## 2020-01-16 LAB — LIPOPROTEIN A (LPA): Lipoprotein (a): 249.3 nmol/L — ABNORMAL HIGH (ref <75.0)

## 2020-01-18 ENCOUNTER — Telehealth: Payer: Self-pay

## 2020-01-18 DIAGNOSIS — E7841 Elevated Lipoprotein(a): Secondary | ICD-10-CM

## 2020-01-18 NOTE — Telephone Encounter (Signed)
-----   Message from [name], MD sent at 01/18/2020  6:32 AM EST ----- Lpa, LDL particle number , LDL, total cholesterol levels are all higher She does not tolerate statins ( double vision) I think she would benefit from PCSK9 inhibitor. Please refer to the lipid clinic for consideration of PCSK9 inh.

## 2020-01-18 NOTE — Telephone Encounter (Signed)
Pt has reviewed MD recommendations via MyChart. Referral for lipid clinic has been placed.

## 2020-03-18 ENCOUNTER — Other Ambulatory Visit: Payer: Self-pay | Admitting: Obstetrics and Gynecology

## 2020-03-18 DIAGNOSIS — R928 Other abnormal and inconclusive findings on diagnostic imaging of breast: Secondary | ICD-10-CM

## 2020-04-05 ENCOUNTER — Other Ambulatory Visit: Payer: Self-pay | Admitting: Obstetrics and Gynecology

## 2020-04-05 ENCOUNTER — Ambulatory Visit
Admission: RE | Admit: 2020-04-05 | Discharge: 2020-04-05 | Disposition: A | Discharge status: Home / Self Care | Payer: 59 | Source: Ambulatory Visit | Attending: Obstetrics and Gynecology | Admitting: Obstetrics and Gynecology

## 2020-04-05 ENCOUNTER — Other Ambulatory Visit: Payer: Self-pay

## 2020-04-05 DIAGNOSIS — R928 Other abnormal and inconclusive findings on diagnostic imaging of breast: Secondary | ICD-10-CM

## 2020-10-07 ENCOUNTER — Other Ambulatory Visit: Payer: Self-pay

## 2020-10-07 ENCOUNTER — Other Ambulatory Visit: Payer: Self-pay | Admitting: Obstetrics and Gynecology

## 2020-10-07 ENCOUNTER — Ambulatory Visit
Admission: RE | Admit: 2020-10-07 | Discharge: 2020-10-07 | Disposition: A | Discharge status: Home / Self Care | Payer: 59 | Source: Ambulatory Visit | Attending: Obstetrics and Gynecology | Admitting: Obstetrics and Gynecology

## 2020-10-07 DIAGNOSIS — R928 Other abnormal and inconclusive findings on diagnostic imaging of breast: Secondary | ICD-10-CM

## 2021-04-06 ENCOUNTER — Other Ambulatory Visit: Payer: Self-pay | Admitting: Obstetrics and Gynecology

## 2021-04-06 DIAGNOSIS — R928 Other abnormal and inconclusive findings on diagnostic imaging of breast: Secondary | ICD-10-CM

## 2021-04-07 ENCOUNTER — Telehealth: Payer: Self-pay

## 2021-04-07 ENCOUNTER — Ambulatory Visit
Admission: RE | Admit: 2021-04-07 | Discharge: 2021-04-07 | Disposition: A | Discharge status: Home / Self Care | Payer: 59 | Source: Ambulatory Visit | Attending: Obstetrics and Gynecology | Admitting: Obstetrics and Gynecology

## 2021-04-07 DIAGNOSIS — R928 Other abnormal and inconclusive findings on diagnostic imaging of breast: Secondary | ICD-10-CM

## 2021-04-07 NOTE — Telephone Encounter (Signed)
-----   Message from Thayer Headings, MD sent at 04/06/2021  7:51 PM EDT ----- ?Hi triage, ? ?Gabriella Grimes is having an atrial tach or atrial fib ?She is scheduled to see me for April 28. ?If there is an open slot before then , will you see if we can work her in ? ?Thanks ? ?Phil ? ? ?

## 2021-04-07 NOTE — Telephone Encounter (Signed)
Called pt to offer an earlier OV with Dr. Elease Hashimoto per his note.  Left a message for pt to call back.  ?

## 2021-04-19 ENCOUNTER — Encounter: Payer: Self-pay | Admitting: Cardiovascular Disease

## 2021-04-19 ENCOUNTER — Ambulatory Visit (INDEPENDENT_AMBULATORY_CARE_PROVIDER_SITE_OTHER): Payer: Commercial Managed Care - PPO | Admitting: Cardiovascular Disease

## 2021-04-19 VITALS — BP 108/62 | HR 54 | Ht 65.0 in | Wt 140.4 lb

## 2021-04-19 DIAGNOSIS — I480 Paroxysmal atrial fibrillation: Secondary | ICD-10-CM | POA: Insufficient documentation

## 2021-04-19 DIAGNOSIS — I7300 Raynaud's syndrome without gangrene: Secondary | ICD-10-CM | POA: Insufficient documentation

## 2021-04-19 MED ORDER — PROPRANOLOL HCL 10 MG PO TABS: 10.0000 mg | ORAL_TABLET | Freq: Four times a day (QID) | ORAL | 1 refills | Status: DC | PRN

## 2021-04-19 NOTE — Patient Instructions (Signed)
Medication Instructions:  ?Your physician has recommended you make the following change in your medication:  ? ?1) START Propranolol (Inderal) 10mg  four times daily as needed for palpitations ? ?*If you need a refill on your cardiac medications before your next appointment, please call your pharmacy* ? ?Lab Work: ?NONE ?If you have labs (blood work) drawn today and your tests are completely normal, you will receive your results only by: ?MyChart Message (if you have MyChart) OR ?A paper copy in the mail ?If you have any lab test that is abnormal or we need to change your treatment, we will call you to review the results. ? ?Testing/Procedures: ?NONE ? ?Follow-Up: ?At [facility], you and your health needs are our priority.  As part of our continuing mission to provide you with exceptional heart care, we have created designated Provider Care Teams.  These Care Teams include your primary Cardiologist (physician) and Advanced Practice Providers (APPs -  Physician Assistants and Nurse Practitioners) who all work together to provide you with the care you need, when you need it. ? ?Your next appointment:   ?6 month(s) ? ?The format for your next appointment:   ?In Person ? ?Provider:   ?Gabriella Moores, MD { ? ?Other Instructions ? ?Important Information About Sugar ? ? ? ? ?  ?

## 2021-04-19 NOTE — Progress Notes (Signed)
? ?Cardiology Office Note ? ? ?Date:  04/19/2021  ? ?ID:  Gabriella Grimes, DOB [birth year], MRN [id number] ? ?PCP:  Shon Baton, MD  ?Cardiologist:   Mertie Moores, MD  ? ?Chief Complaint  ?Patient presents with  ? Atrial Fibrillation  ? ? ?  ?Previous notes.  ?Gabriella Grimes is a 56 y.o. female who presents for follow up of her paroxysmal atrial fib.  ? ? has had intermittent palpitations and atypical chest pain for the past several years. I saw her during a house visit on December 24. She was having heartbeat irregularities. . Clinically the rhythm felt like atrial fibrillation. ?She was having some chest discomfort but her rhythm was completely normal. We placed an event monitor. ? ?Event monitor reveals predominantly normal sinus rhythm. She has frequent premature atrial contractions , occasional PVCs. She has also had some runs of paroxysmal atrial fibrillation. ? ?She remains active.  She hurt her back so she has not been as active as she normally is. ?Runs on occasion.   ? ?Jan. 23, 2018: ? ?Doing well. ?Thinks she has had an episode of SVT and another episode of PAF .   Was playing tennis at the time.   ?Felt a little faint but played through the episode.  Associated with a stressful time  ?Still running some.   Does the elllptical and stair climber regularly .    ? ?Feb. 13, 2018: ? ?[name] Is seen today as a work in visit. She started having episodes of tachycardia while playing tennis. Her rate was around 110. She was brought in for an EKG and further evaluation. ? ?Her EKG revealed junctional tachycardia at a rate of 110. She had retrograde P waves. ?She was hemodynamically very stable. ? ?Sept. 26, 2018:   ? ?No tachycardia .  Occasional short burst ( several seconds)  ?Seems to be more stress  ?Rare episodes of CP - not related to activity  ?Wants to have LPa drawn .   ? 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  A coronary calcium score is 1.  We started her on low-dose rosuvastatin.   She stopped the Rosuvastatin because of some eyelid drooping  ?( found an article related to rosuvastatin and lip drooping  ? ?Nov. 24, 2020: ? ?Gabriella Grimes is seen today for follow up of her PAF and hyperlipidemia .  Her coronary calcium score was 1 in 2018. ?Doing well .  Has occasional palpitations. ?Had some palps while driving to work the other day  HR was ~ 105.   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 ?Has elevated LPa ?LDL, HDL , Trigs look great  ?Has tried statins in the past.   Had some double visioin and lid droop and she stopped it .   ? ? I've discussed with Dr. Debara Pickett the issue of an elevated LPa in the setting of normal chol levels. ?At present the only agents that lower LPa are the PCSK-9 inhibitors and she likely is not a candidate since her chol levels are so good. ?There may be some agents available to specifically lower LPa in the future but not available now. ? ? ?Jan. 7, 2022: ?Gabriella Grimes is seen for follow up of her PAF and elevated  LPa  ?Has intermittant PAF , only lasts for a few seconds ( < 30 )  ?Has intermittant cp  ?No episodes of tachycardia .  She had junctional tachycardia in May, 2018.  ? ?April 19, 2021 ?Gabriella Grimes is seen for follow up of her PAF , elevated LPa, ?Has PAF,  had several episodes over the past several weeks  ?The episodes are rare and random. ?Apple watch showed PAF. ?Converted back to NSR within minutes. ?May be related to volume depletion. ?Discussed Liquid IV  ?Does not seem to be associated with hot flashes .  ?Not necessarly due to caffeine -  ?Does not alter her caffeine  ?Is not related to activity .  ?Has Raynaulds - affects R thumb ?Sister has a clotting disorder  ? ?Still having double vision - only on the tennis courts, after playing for a while . ? ?We discussed cramping in her feet  and legs.  She suggested that she try magnesium and I completely agree with that.  As it is probably magnesium or potassium deficiency along with some dehydration.  I reassured her that this is not a cardiac issue.  Her pulses are intact  ? ?Past Medical History:  ?Diagnosis Date  ? Erythema nodosum   ? Heart murmur   ? MVP  ? Seasonal allergies   ? ? ?Past Surgical History:  ?Procedure Laterality Date  ? CHOLECYSTECTOMY  1995  ? ? ? ?Current Outpatient Medications  ?Medication Sig Dispense Refill  ? Calcium Carb-Cholecalciferol (CALCIUM 500+D) 500-200 MG-UNIT TABS Take 1 tablet by mouth daily in the afternoon.    ? Cyanocobalamin (VITAMIN B-12 PO) Take 1 tablet by mouth daily in the afternoon.    ? dibucaine (NUPERCAINAL) 1 % OINT 1 application to affected area as needed    ? estradiol (ESTRACE) 0.1 MG/GM vaginal cream INSERT PEA SIZE AMOUNT INTO VAGINA EVERY NIGHT FOR 2 WEEKS, THEN TWICE WEEKLY    ? glucosamine-chondroitin 500-400 MG tablet Take 2 tablets by mouth every morning.    ? ibuprofen (ADVIL) 200 MG tablet 4 tablets    ? Multiple Vitamin (MULTI-VITAMIN PO) Take 1 tablet by mouth daily in the afternoon.    ? Omega 3 1000 MG CAPS 1 capsule    ? Probiotic CHEW 1 gummy    ? triamcinolone cream (KENALOG) 0.5 % 1 application to affected area    ? ?No current facility-administered medications for this visit.  ? ? ?Allergies:   Patient has no known allergies.  ? ? ?Social History:  The patient  reports that she has never smoked. She has never used smokeless tobacco. She reports current alcohol use. She reports that she does not use drugs.  ? ?Family History:  The patient's family history includes Arrhythmia in her mother; Diabetes in her mother; Heart attack in her father.  ? ? ?ROS:  Please see the history of present illness.   Otherwise, review of systems are positive for occasional back pains.  Occasional typical CP .   All other systems are reviewed and negative.  ? ?Physical Exam: ?Blood pressure 108/62,  pulse (!) 54, height 5\' 5"  (1.651 m), weight 140 lb 6.4 oz (63.7 kg), SpO2 99 %. ? ?GEN:  Well nourished, well developed in no acute distress ?HEENT: Normal ?NECK: No JVD; No carotid bruits ?LYMPHATICS: No lymphadenopathy ?CARDIAC: RRR , very soft systolic murmur  ?RESPIRATORY:  Clear to auscultation without rales, wheezing or rhonchi  ?ABDOMEN: Soft, non-tender, non-distended ?MUSCULOSKELETAL:  No edema; No deformity  ?  SKIN: Warm and dry,  new ladybug tatoo on her left wrist ?NEUROLOGIC:  Alert and oriented x 3 ? ? ? ?EKG:      April 19, 2021: Sinus bradycardia 54.  No ST or T wave changes. ? ?Recent Labs: ?No results found for requested labs within last 8760 hours.  ? ? ?Lipid Panel ?   ?Component Value Date/Time  ? CHOL 141 03/03/2019 1004  ? TRIG 47 03/03/2019 1004  ? TRIG 48 12/26/2016 0910  ? HDL 73 03/03/2019 1004  ? HDL 68 12/26/2016 0910  ? CHOLHDL 1.9 03/03/2019 1004  ? [place] 57 03/03/2019 1004  ? ?  ? ?Wt Readings from Last 3 Encounters:  ?04/19/21 140 lb 6.4 oz (63.7 kg)  ?01/15/20 143 lb 6.4 oz (65 kg)  ?12/02/18 137 lb 12.8 oz (62.5 kg)  ?  ? ? ?Other studies Reviewed: ?Additional studies/ records that were reviewed today include: 30 day event monitor. ?Review of the above records demonstrates: paroxysmal atrial fib ? ? ?ASSESSMENT AND PLAN: ? ? ?1.      Paroxysmal atrial fib:   Continues to have intermittant palpitations .  Her apple watch revealed an episode of PAF several weeks ago .  ?Her CHADS 2 VASC score is 0.   ?Still has occasional/rare episodes of paroxysmal atrial fibrillation.  At this rate her A-fib burden is very low.  Her CHA2DS2-VASc score is 0 so she is at low risk for thromboembolic disease.  We will give her propranolol to take on an as-needed basis if she has prolonged episodes of PAF. ? ?2.  Elevated LPa:   I've discussed with Dr. Debara Pickett the issue of an elevated LPa in the setting of normal chol levels. ?At present the only agents that lower LPa are the PCSK-9 inhibitors and she  likely is not a candidate since her chol levels are so good. ?There may be some agents available to specifically lower LPa in the future but not available now.   Will continue to follow  ? ? She has had doubl

## 2021-05-05 ENCOUNTER — Ambulatory Visit: Payer: Commercial Managed Care - PPO | Admitting: Cardiovascular Disease

## 2021-11-08 ENCOUNTER — Other Ambulatory Visit: Payer: Self-pay | Admitting: Internal Medicine

## 2021-11-08 DIAGNOSIS — E785 Hyperlipidemia, unspecified: Secondary | ICD-10-CM

## 2021-11-13 ENCOUNTER — Encounter: Payer: Self-pay | Admitting: Cardiovascular Disease

## 2021-11-13 NOTE — Progress Notes (Unsigned)
Cardiology Office Note   Date:  04/19/2021   ID:  Gabriella Grimes, DOB 28-Aug-1965, MRN [id number]  PCP:  Creola Corn, MD  Cardiologist:   Kristeen Miss, MD   Chief Complaint  Patient presents with   Atrial Fibrillation      Previous notes.  Gabriella Grimes is a 56 y.o. female who presents for follow up of her paroxysmal atrial fib.    has had intermittent palpitations and atypical chest pain for the past several years. I saw her during a house visit on December 24. She was having heartbeat irregularities. . Clinically the rhythm felt like atrial fibrillation. She was having some chest discomfort but her rhythm was completely normal. We placed an event monitor.  Event monitor reveals predominantly normal sinus rhythm. She has frequent premature atrial contractions , occasional PVCs. She has also had some runs of paroxysmal atrial fibrillation.  She remains active.  She hurt her back so she has not been as active as she normally is. Runs on occasion.    Jan. 23, 2018:  Doing well. Thinks she has had an episode of SVT and another episode of PAF .   Was playing tennis at the time.   Felt a little faint but played through the episode.  Associated with a stressful time  Still running some.   Does the elllptical and stair climber regularly .     Feb. 13, 2018:  Gabriella Grimes Is seen today as a work in visit. She started having episodes of tachycardia while playing tennis. Her rate was around 110. She was brought in for an EKG and further evaluation.  Her EKG revealed junctional tachycardia at a rate of 110. She had retrograde P waves. She was hemodynamically very stable.  Sept. 26, 2018:    No tachycardia .  Occasional short burst ( several seconds)  Seems to be more stress  Rare episodes of CP - not related to activity  Wants to have LPa drawn .    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  A coronary calcium score is 1.  We started her on low-dose rosuvastatin.   She stopped the Rosuvastatin because of some eyelid drooping  ( found an article related to rosuvastatin and lip drooping   Nov. 24, 2020:  Gabriella Grimes is seen today for follow up of her PAF and hyperlipidemia .  Her coronary calcium score was 1 in 2018. Doing well .  Has occasional palpitations. Had some palps while driving to work the other day  HR was ~ 105.   She thought it felt regular  Playing tennis regulalry .   Strained her back last week   Has elevated LPa LDL, HDL , Trigs look great  Has tried statins in the past.   Had some double visioin and lid droop and she stopped it .     I've discussed with Dr. Rennis Golden the issue of an elevated LPa in the setting of normal chol levels. At present the only agents that lower LPa are the PCSK-9 inhibitors and she likely is not a candidate since her chol levels are so good. There may be some agents available to specifically lower LPa in the future but not available now.   Jan. 7, 2022: Gabriella Grimes is seen for follow up of her PAF and elevated  LPa  Has intermittant PAF , only lasts for a few seconds ( < 30 )  Has intermittant cp  No episodes of tachycardia .  She had junctional tachycardia in May, 2018.   April 19, 2021 Gabriella Grimes is seen for follow up of her PAF , elevated LPa, Has PAF,  had several episodes over the past several weeks  The episodes are rare and random. Apple watch showed PAF. Converted back to NSR within minutes. May be related to volume depletion. Discussed Liquid IV  Does not seem to be associated with hot flashes .  Not necessarly due to caffeine -  Does not alter her caffeine  Is not related to activity .  Has Raynaulds - affects R thumb Sister has a clotting disorder   Still having double vision - only on the tennis courts, after playing for a while .  We discussed cramping in her feet  and legs.  She suggested that she try magnesium and I completely agree with that.  As it is probably magnesium or potassium deficiency along with some dehydration.  I reassured her that this is not a cardiac issue.  Her pulses are intact   Nov. 7, 2023  Gabriella Grimes is seen for follow up of her PAF,  + family hx of CAD    Past Medical History:  Diagnosis Date   Erythema nodosum    Heart murmur    MVP   Seasonal allergies     Past Surgical History:  Procedure Laterality Date   CHOLECYSTECTOMY  1995     Current Outpatient Medications  Medication Sig Dispense Refill   Calcium Carb-Cholecalciferol (CALCIUM 500+D) 500-200 MG-UNIT TABS Take 1 tablet by mouth daily in the afternoon.     Cyanocobalamin (VITAMIN B-12 PO) Take 1 tablet by mouth daily in the afternoon.     dibucaine (NUPERCAINAL) 1 % OINT 1 application to affected area as needed     estradiol (ESTRACE) 0.1 MG/GM vaginal cream INSERT PEA SIZE AMOUNT INTO VAGINA EVERY NIGHT FOR 2 WEEKS, THEN TWICE WEEKLY     glucosamine-chondroitin 500-400 MG tablet Take 2 tablets by mouth every morning.     ibuprofen (ADVIL) 200 MG tablet 4 tablets     Multiple Vitamin (MULTI-VITAMIN PO) Take 1 tablet by mouth daily in the afternoon.     Omega 3 1000 MG CAPS 1 capsule     Probiotic CHEW 1 gummy     triamcinolone cream (KENALOG) 0.5 % 1 application to affected area     No current facility-administered medications for this visit.    Allergies:   Patient has no known allergies.    Social History:  The patient  reports that she has never smoked. She has never used smokeless tobacco. She reports current alcohol use. She reports that she does not use drugs.   Family History:  The patient's family history includes Arrhythmia in her mother; Diabetes in her mother; Heart attack in her father.    ROS:  Please see the history of present illness.   Otherwise, review of systems are positive for occasional back pains.  Occasional typical CP .   All other  systems are reviewed and negative.    Physical Exam: There were no vitals taken for this visit.  No BP recorded.  {Refresh Note OR Click here to enter BP  :1}***    GEN:  Well nourished, well developed in no acute distress HEENT: Normal NECK: No JVD; No carotid bruits LYMPHATICS: No lymphadenopathy CARDIAC: RRR ***,  no murmurs, rubs, gallops RESPIRATORY:  Clear to auscultation without rales, wheezing or rhonchi  ABDOMEN: Soft, non-tender, non-distended MUSCULOSKELETAL:  No edema; No deformity  SKIN: Warm and dry NEUROLOGIC:  Alert and oriented x 3     EKG:         Recent Labs: No results found for requested labs within last 8760 hours.    Lipid Panel    Component Value Date/Time   CHOL 141 03/03/2019 1004   TRIG 47 03/03/2019 1004   TRIG 48 12/26/2016 0910   HDL 73 03/03/2019 1004   HDL 68 12/26/2016 0910   CHOLHDL 1.9 03/03/2019 1004   LDLCALC 57 03/03/2019 1004      Wt Readings from Last 3 Encounters:  04/19/21 140 lb 6.4 oz (63.7 kg)  01/15/20 143 lb 6.4 oz (65 kg)  12/02/18 137 lb 12.8 oz (62.5 kg)      Other studies Reviewed: Additional studies/ records that were reviewed today include: 30 day event monitor. Review of the above records demonstrates: paroxysmal atrial fib   ASSESSMENT AND PLAN:   1.      Paroxysmal atrial fib:      2.  Elevated LPa:      3. Family history of cardiac disease.          Current medicines are reviewed at length with the patient today.  The patient does not have concerns regarding medicines.  The following changes have been made:  no change  Labs/ tests ordered today include:   No orders of the defined types were placed in this encounter.  Disposition:      Signed, Kristeen Miss, MD  04/19/2021 10:18 AM    [facility] [address] [place], [place], [place]  [address] Phone: [phone]; Fax: [phone]

## 2021-11-14 ENCOUNTER — Encounter: Payer: Self-pay | Admitting: Cardiovascular Disease

## 2021-11-14 ENCOUNTER — Ambulatory Visit: Payer: Commercial Managed Care - PPO | Attending: Cardiovascular Disease | Admitting: Cardiovascular Disease

## 2021-11-14 VITALS — BP 112/70 | HR 48 | Ht 65.0 in | Wt 130.8 lb

## 2021-11-14 DIAGNOSIS — I480 Paroxysmal atrial fibrillation: Secondary | ICD-10-CM

## 2021-11-14 DIAGNOSIS — I7300 Raynaud's syndrome without gangrene: Secondary | ICD-10-CM

## 2021-11-14 MED ORDER — AMLODIPINE BESYLATE 2.5 MG PO TABS: 2.5000 mg | ORAL_TABLET | Freq: Every day | ORAL | 3 refills | Status: DC

## 2021-11-14 NOTE — Patient Instructions (Signed)
Medication Instructions:  START Amlodipine 2.5mg  daily as needed for Raynaud's syndrome *If you need a refill on your cardiac medications before your next appointment, please call your pharmacy*   Lab Work: NONE If you have labs (blood work) drawn today and your tests are completely normal, you will receive your results only by: [place] (if you have MyChart) OR A paper copy in the mail If you have any lab test that is abnormal or we need to change your treatment, we will call you to review the results.   Testing/Procedures: NONE    Follow-Up: At [facility], you and your health needs are our priority.  As part of our continuing mission to provide you with exceptional heart care, we have created designated Provider Care Teams.  These Care Teams include your primary Cardiologist (physician) and Advanced Practice Providers (APPs -  Physician Assistants and Nurse Practitioners) who all work together to provide you with the care you need, when you need it.  We recommend signing up for the patient portal called "MyChart".  Sign up information is provided on this After Visit Summary.  MyChart is used to connect with patients for Virtual Visits (Telemedicine).  Patients are able to view lab/test results, encounter notes, upcoming appointments, etc.  Non-urgent messages can be sent to your provider as well.   To learn more about what you can do with MyChart, go to [email or web address].    Your next appointment:   1 year(s)  The format for your next appointment:   In Person  Provider:   Mertie Moores, MD       Important Information About Sugar

## 2021-12-15 ENCOUNTER — Ambulatory Visit: Payer: Commercial Managed Care - PPO | Attending: Cardiovascular Disease

## 2021-12-15 ENCOUNTER — Other Ambulatory Visit: Payer: Commercial Managed Care - PPO

## 2021-12-15 ENCOUNTER — Telehealth: Payer: Self-pay | Admitting: Cardiovascular Disease

## 2021-12-15 DIAGNOSIS — I480 Paroxysmal atrial fibrillation: Secondary | ICD-10-CM

## 2021-12-15 MED ORDER — APIXABAN 5 MG PO TABS: 5.0000 mg | ORAL_TABLET | Freq: Two times a day (BID) | ORAL | 11 refills | Status: DC

## 2021-12-15 MED ORDER — PROPRANOLOL HCL 10 MG PO TABS: 10.0000 mg | ORAL_TABLET | Freq: Four times a day (QID) | ORAL | 1 refills | Status: DC | PRN

## 2021-12-15 NOTE — Progress Notes (Unsigned)
Enrolled patient for a 14 day Zio XT  monitor to be mailed to patients home  °

## 2021-12-15 NOTE — Telephone Encounter (Signed)
Dr Elease Hashimoto called again, would like pt to be started on Eliquis 5mg  BID. Medication sent to pharmacy on file, samples left up front, and patient aware.

## 2021-12-15 NOTE — Telephone Encounter (Signed)
Patient called and spoke with Dr Elease Hashimoto about a recent syncopal episode with tachycardia. Patient has history of PAF. Per Dr Elease Hashimoto, he would like her to have a 14 day zio monitor, ECHO, needs to be seen (12/22/21) and update propranolol rx 10mg  QID prn.  Called and spoke with patient. All above orders have been entered and scheduled.

## 2021-12-16 ENCOUNTER — Telehealth (INDEPENDENT_AMBULATORY_CARE_PROVIDER_SITE_OTHER): Payer: Self-pay | Admitting: Cardiovascular Disease

## 2021-12-16 NOTE — Telephone Encounter (Signed)
Chundra called  on Dec. 8 , 2023 with the complaint of palpitations / inappropriate tachycardia  Had just finished a pickleball clinic Not especially difficult, not a true cardio workout  The tachycardia continued for several hours.  She called for advice Kardia mobile revealed narrow complex tachycardia  No definite P waves      ? Concerning for atrial fib. Was started on Eliquis 5 BID Took Proppranolol 10 mg ,   felt better   By the next day, she felt well,  HR back to normal   She stopped the Eliquis  Will continue to take propranolol as needed   ? Actual atrial fib, vs. POTS, vs junctional tachycardia Will see her on Friday for office visit and ECG She has had documented atrial fib in the past   Will discuss EP evaluation , ? Possible Afib ablation. She is feeling well today  Will keep hydrated ,   Will add electrolytes to her water ,    She is bradycardic at baseline - I doubt she would tolerate scheduled beta blocker or flecainide   Will discuss further at Friday's office visit

## 2021-12-18 ENCOUNTER — Other Ambulatory Visit (HOSPITAL_COMMUNITY): Payer: Commercial Managed Care - PPO

## 2021-12-18 DIAGNOSIS — I480 Paroxysmal atrial fibrillation: Secondary | ICD-10-CM | POA: Diagnosis not present

## 2021-12-21 ENCOUNTER — Encounter: Payer: Self-pay | Admitting: Cardiovascular Disease

## 2021-12-21 NOTE — Progress Notes (Signed)
Cardiology Office Note   Date:  12/22/2021   ID:  Gabriella Grimes, DOB [birth year], MRN [id number]  PCP:  Shon Baton, MD  Cardiologist:   Mertie Moores, MD   Chief Complaint  Patient presents with   Atrial Fibrillation   raynauds      Previous notes.  Gabriella Grimes is a 56 y.o. female who presents for follow up of her paroxysmal atrial fib.    has had intermittent palpitations and atypical chest pain for the past several years. I saw her during a house visit on December 24. She was having heartbeat irregularities. . Clinically the rhythm felt like atrial fibrillation. She was having some chest discomfort but her rhythm was completely normal. We placed an event monitor.  Event monitor reveals predominantly normal sinus rhythm. She has frequent premature atrial contractions , occasional PVCs. She has also had some runs of paroxysmal atrial fibrillation.  She remains active.  She hurt her back so she has not been as active as she normally is. Runs on occasion.    Jan. 23, 2018:  Doing well. Thinks she has had an episode of SVT and another episode of PAF .   Was playing tennis at the time.   Felt a little faint but played through the episode.  Associated with a stressful time  Still running some.   Does the elllptical and stair climber regularly .     Feb. 13, 2018:  Gabriella Grimes Is seen today as a work in visit. She started having episodes of tachycardia while playing tennis. Her rate was around 110. She was brought in for an EKG and further evaluation.  Her EKG revealed junctional tachycardia at a rate of 110. She had retrograde P waves. She was hemodynamically very stable.  Sept. 26, 2018:    No tachycardia .  Occasional short burst ( several seconds)  Seems to be more stress  Rare episodes of CP - not related to activity  Wants to have LPa drawn .    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  A coronary calcium score is 1.  We started her on low-dose rosuvastatin.   She stopped the Rosuvastatin because of some eyelid drooping  ( found an article related to rosuvastatin and lip drooping   Nov. 24, 2020:  Gabriella Grimes is seen today for follow up of her PAF and hyperlipidemia .  Her coronary calcium score was 1 in 2018. Doing well .  Has occasional palpitations. Had some palps while driving to work the other day  HR was ~ 105.   She thought it felt regular  Playing tennis regulalry .   Strained her back last week   Has elevated LPa LDL, HDL , Trigs look great  Has tried statins in the past.   Had some double visioin and lid droop and she stopped it .     I've discussed with Dr. Debara Pickett the issue of an elevated LPa in the setting of normal chol levels. At present the only agents that lower LPa are the PCSK-9 inhibitors and she likely is not a candidate since her chol levels are so good. There may be some agents available to specifically lower LPa in the future but not available now.   Jan. 7, 2022: Gabriella Grimes is seen for follow up of her  PAF and elevated LPa  Has intermittant PAF , only lasts for a few seconds ( < 30 )  Has intermittant cp  No episodes of tachycardia .  She had junctional tachycardia in May, 2018.   April 19, 2021 Gabriella Grimes is seen for follow up of her PAF , elevated LPa, Has PAF,  had several episodes over the past several weeks  The episodes are rare and random. Apple watch showed PAF. Converted back to NSR within minutes. May be related to volume depletion. Discussed Liquid IV  Does not seem to be associated with hot flashes .  Not necessarly due to caffeine -  Does not alter her caffeine  Is not related to activity .  Has Raynaulds - affects R thumb Sister has a clotting disorder   Still having double vision - only on the tennis courts, after playing for a while .  We discussed  cramping in her feet and legs.  She suggested that she try magnesium and I completely agree with that.  As it is probably magnesium or potassium deficiency along with some dehydration.  I reassured her that this is not a cardiac issue.  Her pulses are intact   Nov. 7, 2023  Gabriella Grimes is seen for follow up of her PAF,  + family hx of CAD  Coronary artery calcium score was 1 ( 80th percentile for age / sex matched controls )   Echo from Oct. 2014 shows normal LV function , grade I DD. Trivial MR , mild LAE  Has raynauds syndrome   No significant episodes of palpitation  Labs from Dr. Timothy Lasso  Chol =  HDL =  LDL =  Trigs =   Dec. 14, 2023 Gabriella Grimes is seen for follow up of a recent episode of Atrial fib Hx of HLD,  Last LDL in Jan. 2022 is 135.  Lipid levels from her primary medical doctor from October 24, 2021  Total cholesterol is 204 Triglyceride level is 36 HDL is 60  LDL is 137 Lp(a) is 249  Is repeating her coronary calcium scan with Dr. Timothy Lasso.     She had an episode of atrial fib with RVR vs. Junctional tachycardia earlier this week   Takes propranolol as needed for her palpitations  Has eliquis at the pharmacy - ready to pick up      Past Medical History:  Diagnosis Date   Erythema nodosum    Heart murmur    MVP   Seasonal allergies     Past Surgical History:  Procedure Laterality Date   CHOLECYSTECTOMY  1995     Current Outpatient Medications  Medication Sig Dispense Refill   amLODipine (NORVASC) 2.5 MG tablet Take 1 tablet (2.5 mg total) by mouth daily. (Patient taking differently: Take 2.5 mg by mouth as needed.) 30 tablet 3   Calcium Carb-Cholecalciferol (CALCIUM 500+D) 500-200 MG-UNIT TABS Take 1 tablet by mouth daily in the afternoon.     Cyanocobalamin (VITAMIN B-12 PO) Take 1 tablet by mouth daily in the afternoon.     dibucaine (NUPERCAINAL) 1 % OINT 1 application to affected area as needed     estradiol (ESTRACE) 0.1 MG/GM vaginal cream INSERT PEA  SIZE AMOUNT INTO VAGINA EVERY NIGHT FOR 2 WEEKS, THEN TWICE WEEKLY     glucosamine-chondroitin 500-400 MG tablet Take 2 tablets by mouth every morning.     Multiple Vitamin (MULTI-VITAMIN PO) Take 1 tablet by mouth daily in the afternoon.     Omega 3 1000 MG CAPS  1 capsule     Probiotic CHEW 1 gummy     propranolol (INDERAL) 10 MG tablet Take 1 tablet (10 mg total) by mouth 4 (four) times daily as needed (palpitations). 30 tablet 1   triamcinolone cream (KENALOG) 0.5 % 1 Application as needed. 1 application to affected area     No current facility-administered medications for this visit.    Allergies:   Patient has no known allergies.    Social History:  The patient  reports that she has never smoked. She has never used smokeless tobacco. She reports current alcohol use. She reports that she does not use drugs.   Family History:  The patient's family history includes Arrhythmia in her mother; Diabetes in her mother; Heart attack in her father.    ROS:  Please see the history of present illness.   Otherwise, review of systems are positive for occasional back pains.  Occasional typical CP .   All other systems are reviewed and negative.    Physical Exam: Blood pressure 108/80, pulse (!) 51, height 5\' 5"  (1.651 m), weight 130 lb (59 kg), SpO2 97 %.       GEN:  Well nourished, well developed in no acute distress HEENT: Normal NECK: No JVD; No carotid bruits LYMPHATICS: No lymphadenopathy CARDIAC: RRR ,  soft systolic murmur  RESPIRATORY:  clear  ABDOMEN: Soft, non-tender, non-distended MUSCULOSKELETAL:  no edema  SKIN: Warm and dry NEUROLOGIC:  Alert and oriented x 3     EKG:      December 22, 2021: Sinus bradycardia 51.   Recent Labs: No results found for requested labs within last 365 days.    Lipid Panel    Component Value Date/Time   CHOL 141 03/03/2019 1004   TRIG 47 03/03/2019 1004   TRIG 48 12/26/2016 0910   HDL 73 03/03/2019 1004   HDL 68 12/26/2016 0910    CHOLHDL 1.9 03/03/2019 1004   LDLCALC 57 03/03/2019 1004      Wt Readings from Last 3 Encounters:  12/22/21 130 lb (59 kg)  11/14/21 130 lb 12.8 oz (59.3 kg)  04/19/21 140 lb 6.4 oz (63.7 kg)      Other studies Reviewed: Additional studies/ records that were reviewed today include: 30 day event monitor. Review of the above records demonstrates: paroxysmal atrial fib   ASSESSMENT AND PLAN:   1.      Paroxysmal atrial fib:   Had an episode of tachycardia last week.  It is possible that this was another episode of paroxysmal atrial fibrillation versus an episode of junctional tachycardia.  The episodes last for an hour or so.  She takes propranolol as needed.  Her CHADS2 Vascor is only 1 ( > CAC of 1 , may need to add CAD as a risk factor is this worsens )    2.  Elevated LPa:  249.  She is going for repeat coronary artery calcium score in a month.  If her coronary calcium score has worsened, I would like for her to get back to the lipid clinic for consideration of a PCSK9 inhibitor.  Her LDL is elevated.  She is tried statins in the past but had side effects.   3. Family history of cardiac disease.  She has a family history of premature coronary artery disease.   4.  Raynauds syndrome :   She is on amlodipine daily or as needed.  She will continue to watch.  I will see her again in 3 months.  Current medicines are reviewed at length with the patient today.  The patient does not have concerns regarding medicines.  The following changes have been made:  no change  Labs/ tests ordered today include:   Orders Placed This Encounter  Procedures   EKG 12-Lead   Disposition:      Signed, Mertie Moores, MD  12/22/2021 6:46 PM    [facility], [place], [place]  [address] Phone: [phone]; Fax: [phone]

## 2021-12-22 ENCOUNTER — Ambulatory Visit: Payer: Commercial Managed Care - PPO | Attending: Cardiovascular Disease | Admitting: Cardiovascular Disease

## 2021-12-22 ENCOUNTER — Encounter: Payer: Self-pay | Admitting: Cardiovascular Disease

## 2021-12-22 VITALS — BP 108/80 | HR 51 | Ht 65.0 in | Wt 130.0 lb

## 2021-12-22 DIAGNOSIS — E782 Mixed hyperlipidemia: Secondary | ICD-10-CM | POA: Diagnosis not present

## 2021-12-22 DIAGNOSIS — I480 Paroxysmal atrial fibrillation: Secondary | ICD-10-CM

## 2021-12-22 DIAGNOSIS — I7300 Raynaud's syndrome without gangrene: Secondary | ICD-10-CM | POA: Diagnosis not present

## 2021-12-22 NOTE — Patient Instructions (Signed)
Medication Instructions:  Your physician recommends that you continue on your current medications as directed. Please refer to the Current Medication list given to you today.  *If you need a refill on your cardiac medications before your next appointment, please call your pharmacy*   Lab Work: NONE If you have labs (blood work) drawn today and your tests are completely normal, you will receive your results only by: MyChart Message (if you have MyChart) OR A paper copy in the mail If you have any lab test that is abnormal or we need to change your treatment, we will call you to review the results.   Testing/Procedures: NONE   Follow-Up: At [facility], you and your health needs are our priority.  As part of our continuing mission to provide you with exceptional heart care, we have created designated Provider Care Teams.  These Care Teams include your primary Cardiologist (physician) and Advanced Practice Providers (APPs -  Physician Assistants and Nurse Practitioners) who all work together to provide you with the care you need, when you need it.  We recommend signing up for the patient portal called "MyChart".  Sign up information is provided on this After Visit Summary.  MyChart is used to connect with patients for Virtual Visits (Telemedicine).  Patients are able to view lab/test results, encounter notes, upcoming appointments, etc.  Non-urgent messages can be sent to your provider as well.   To learn more about what you can do with MyChart, go to [email or web address].    Your next appointment:   3 month(s)  The format for your next appointment:   In Person  Provider:   Philip Nahser, MD    Important Information About Sugar       

## 2022-01-09 ENCOUNTER — Ambulatory Visit (HOSPITAL_COMMUNITY): Payer: Commercial Managed Care - PPO | Attending: Cardiology

## 2022-01-09 DIAGNOSIS — Z8249 Family history of ischemic heart disease and other diseases of the circulatory system: Secondary | ICD-10-CM | POA: Insufficient documentation

## 2022-01-09 DIAGNOSIS — Q231 Congenital insufficiency of aortic valve: Secondary | ICD-10-CM | POA: Diagnosis not present

## 2022-01-09 DIAGNOSIS — E785 Hyperlipidemia, unspecified: Secondary | ICD-10-CM | POA: Insufficient documentation

## 2022-01-09 DIAGNOSIS — R000 Tachycardia, unspecified: Secondary | ICD-10-CM | POA: Diagnosis not present

## 2022-01-09 DIAGNOSIS — I501 Left ventricular failure: Secondary | ICD-10-CM | POA: Diagnosis not present

## 2022-01-09 DIAGNOSIS — I480 Paroxysmal atrial fibrillation: Secondary | ICD-10-CM | POA: Insufficient documentation

## 2022-01-09 DIAGNOSIS — I071 Rheumatic tricuspid insufficiency: Secondary | ICD-10-CM | POA: Insufficient documentation

## 2022-01-09 LAB — ECHOCARDIOGRAM COMPLETE
Area-P 1/2: 3.91 cm2
S' Lateral: 3 cm

## 2022-01-19 ENCOUNTER — Ambulatory Visit
Admission: RE | Admit: 2022-01-19 | Discharge: 2022-01-19 | Disposition: A | Discharge status: Home / Self Care | Payer: No Typology Code available for payment source | Source: Ambulatory Visit | Attending: Internal Medicine | Admitting: Internal Medicine

## 2022-01-19 DIAGNOSIS — E785 Hyperlipidemia, unspecified: Secondary | ICD-10-CM

## 2022-01-23 ENCOUNTER — Encounter: Payer: Self-pay | Admitting: Pharmacist

## 2022-01-23 DIAGNOSIS — E782 Mixed hyperlipidemia: Secondary | ICD-10-CM

## 2022-01-30 MED ORDER — ROSUVASTATIN CALCIUM 20 MG PO TABS: 20.0000 mg | ORAL_TABLET | Freq: Every day | ORAL | 3 refills | Status: DC

## 2022-03-23 ENCOUNTER — Other Ambulatory Visit: Payer: Self-pay | Admitting: Obstetrics and Gynecology

## 2022-03-23 DIAGNOSIS — Z1231 Encounter for screening mammogram for malignant neoplasm of breast: Secondary | ICD-10-CM

## 2022-03-26 ENCOUNTER — Ambulatory Visit: Payer: Commercial Managed Care - PPO | Admitting: Cardiovascular Disease

## 2022-03-26 ENCOUNTER — Encounter: Payer: Self-pay | Admitting: Cardiovascular Disease

## 2022-03-26 NOTE — Progress Notes (Signed)
Cardiology Office Note   Date:  04/05/2022   ID:  Gabriella Grimes, Gabriella Grimes [birth year], MRN [id number]  PCP:  Shon Baton, MD  Cardiologist:   Mertie Moores, MD   Chief Complaint  Patient presents with   Atrial Fibrillation           Previous notes.  Gabriella Grimes is a 57 y.o. female who presents for follow up of her paroxysmal atrial fib.    has had intermittent palpitations and atypical chest pain for the past several years. I saw her during a house visit on December 24. She was having heartbeat irregularities. . Clinically the rhythm felt like atrial fibrillation. She was having some chest discomfort but her rhythm was completely normal. We placed an event monitor.  Event monitor reveals predominantly normal sinus rhythm. She has frequent premature atrial contractions , occasional PVCs. She has also had some runs of paroxysmal atrial fibrillation.  She remains active.  She hurt her back so she has not been as active as she normally is. Runs on occasion.    Jan. 23, 2018:  Doing well. Thinks she has had an episode of SVT and another episode of PAF .   Was playing tennis at the time.   Felt a little faint but played through the episode.  Associated with a stressful time  Still running some.   Does the elllptical and stair climber regularly .     Feb. 13, 2018:  Gabriella Grimes Is seen today as a work in visit. She started having episodes of tachycardia while playing tennis. Her rate was around 110. She was brought in for an EKG and further evaluation.  Her EKG revealed junctional tachycardia at a rate of 110. She had retrograde P waves. She was hemodynamically very stable.  Sept. 26, 2018:    No tachycardia .  Occasional short burst ( several seconds)  Seems to be more stress  Rare episodes of CP - not related to activity  Wants to have LPa drawn .    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  A coronary calcium score is 1.  We started her on low-dose rosuvastatin.   She stopped the Rosuvastatin because of some eyelid drooping  ( found an article related to rosuvastatin and lip drooping   Nov. 24, 2020:  Gabriella Grimes is seen today for follow up of her PAF and hyperlipidemia .  Her coronary calcium score was 1 in 2018. Doing well .  Has occasional palpitations. Had some palps while driving to work the other day  HR was ~ 105.   She thought it felt regular  Playing tennis regulalry .   Strained her back last week   Has elevated LPa LDL, HDL , Trigs look great  Has tried statins in the past.   Had some double visioin and lid droop and she stopped it .     I've discussed with Dr. Debara Pickett the issue of an elevated LPa in the setting of normal chol levels. At present the only agents that lower LPa are the PCSK-9 inhibitors and she likely is not a candidate since her chol levels are so good. There may be some agents available to specifically lower LPa in the future but not available now.   Jan. 7, 2022: Gabriella Grimes is seen for follow up  of her PAF and elevated LPa  Has intermittant PAF , only lasts for a few seconds ( < 30 )  Has intermittant cp  No episodes of tachycardia .  She had junctional tachycardia in May, 2018.   April 19, 2021 Gabriella Grimes is seen for follow up of her PAF , elevated LPa, Has PAF,  had several episodes over the past several weeks  The episodes are rare and random. Apple watch showed PAF. Converted back to NSR within minutes. May be related to volume depletion. Discussed Liquid IV  Does not seem to be associated with hot flashes .  Not necessarly due to caffeine -  Does not alter her caffeine  Is not related to activity .  Has Raynaulds - affects R thumb Sister has a clotting disorder   Still having double vision - only on the tennis courts, after playing for a while .  We discussed  cramping in her feet and legs.  She suggested that she try magnesium and I completely agree with that.  As it is probably magnesium or potassium deficiency along with some dehydration.  I reassured her that this is not a cardiac issue.  Her pulses are intact   Nov. 7, 2023  Gabriella Grimes is seen for follow up of her PAF,  + family hx of CAD  Coronary artery calcium score was 1 ( 80th percentile for age / sex matched controls )   Echo from Oct. 2014 shows normal LV function , grade I DD. Trivial MR , mild LAE  Has raynauds syndrome   No significant episodes of palpitation  Labs from Dr. Virgina Jock  Chol =  HDL =  LDL =  Trigs =   Dec. 14, 2023 Gabriella Grimes is seen for follow up of a recent episode of Atrial fib Hx of HLD,  Last LDL in Jan. 2022 is 135.  Lipid levels from her primary medical doctor from October 24, 2021  Total cholesterol is 204 Triglyceride level is 36 HDL is 60  LDL is 137 Lp(a) is 249  Is repeating her coronary calcium scan with Dr. Virgina Jock.     She had an episode of atrial fib with RVR vs. Junctional tachycardia earlier this week   Takes propranolol as needed for her palpitations  Has eliquis at the pharmacy - ready to pick up    March 27, 2022: Gabriella Grimes is seen today for follow-up of her paroxysmal atrial fibrillation.  She also has a history of hyperlipidemia, elevated LP(a) and a positive family history of coronary artery disease.  Repeat coronary calcium score is 55.5 which places her in the 89th percentile for age and sex matched controls.  Previous coronary artery calcium score from October, 2018 was 1 which places her in the 80th percentile for age and sex matched controls.  She has been working with our lipid clinic trying to dial in the appropriate dose of her statin.  She is having some muscle aches and pains. Tried Rosuvastatin 20 mg again .    She had some "unusual pains" Hand pain, foot pain , neck click sound  Is now on rosuvastatin 10 mg a day     Past  Medical History:  Diagnosis Date   Erythema nodosum    Heart murmur    MVP   Seasonal allergies     Past Surgical History:  Procedure Laterality Date   CHOLECYSTECTOMY  1995     Current Outpatient Medications  Medication Sig Dispense Refill   Calcium Carb-Cholecalciferol (CALCIUM  500+D) 500-200 MG-UNIT TABS Take 1 tablet by mouth daily in the afternoon.     dibucaine (NUPERCAINAL) 1 % OINT 1 application to affected area as needed     estradiol (ESTRACE) 0.1 MG/GM vaginal cream INSERT PEA SIZE AMOUNT INTO VAGINA EVERY NIGHT FOR 2 WEEKS, THEN TWICE WEEKLY     glucosamine-chondroitin 500-400 MG tablet Take 2 tablets by mouth every morning.     Multiple Vitamin (MULTI-VITAMIN PO) Take 1 tablet by mouth daily in the afternoon.     Omega 3 1000 MG CAPS 1 capsule     propranolol (INDERAL) 10 MG tablet Take 1 tablet (10 mg total) by mouth 4 (four) times daily as needed (palpitations). 30 tablet 1   rosuvastatin (CRESTOR) 20 MG tablet Take 1 tablet (20 mg total) by mouth daily. 90 tablet 3   triamcinolone cream (KENALOG) 0.5 % 1 Application as needed. 1 application to affected area     No current facility-administered medications for this visit.    Allergies:   Patient has no known allergies.    Social History:  The patient  reports that she has never smoked. She has never used smokeless tobacco. She reports current alcohol use. She reports that she does not use drugs.   Family History:  The patient's family history includes Arrhythmia in her mother; Diabetes in her mother; Heart attack in her father.    ROS:  Please see the history of present illness.   Otherwise, review of systems are positive for occasional back pains.  Occasional typical CP .   All other systems are reviewed and negative.    Physical Exam: Blood pressure (!) 102/58, pulse (!) 55, height 5\' 5"  (1.651 m), weight 138 lb (62.6 kg), SpO2 99 %.    GEN:  Well nourished, well developed in no acute distress HEENT:  Normal NECK: No JVD; No carotid bruits LYMPHATICS: No lymphadenopathy CARDIAC: RRR , no murmurs, rubs, gallops... RESPIRATORY:  Clear to auscultation without rales, wheezing or rhonchi  ABDOMEN: Soft, non-tender, non-distended MUSCULOSKELETAL:  No edema; No deformity  SKIN: Warm and dry NEUROLOGIC:  Alert and oriented x 3     EKG:          Recent Labs: No results found for requested labs within last 365 days.    Lipid Panel    Component Value Date/Time   CHOL 141 03/03/2019 1004   TRIG 47 03/03/2019 1004   TRIG 48 12/26/2016 0910   HDL 73 03/03/2019 1004   HDL 68 12/26/2016 0910   CHOLHDL 1.9 03/03/2019 1004   LDLCALC 57 03/03/2019 1004      Wt Readings from Last 3 Encounters:  03/27/22 138 lb (62.6 kg)  12/22/21 130 lb (59 kg)  11/14/21 130 lb 12.8 oz (59.3 kg)      Other studies Reviewed: Additional studies/ records that were reviewed today include: 30 day event monitor. Review of the above records demonstrates: paroxysmal atrial fib   ASSESSMENT AND PLAN:   1.      Paroxysmal atrial fib:     continues to have intermittant palpitations, very brief.  We have only seen very rare, brief episodes of PAF  CHADS2VASC is 1 ( female)  Will continue to follow     2.  Elevated LPa:  249.    Working on adjusting her lipids  She has not tolerated the Rosuvastatin very well Will have her continue to work with our lipid clinic I would favor having her start a PCSK9 inhibitor if  she is a candidate       3. Family history of cardiac disease.      4.  Raynauds syndrome :           Current medicines are reviewed at length with the patient today.  The patient does not have concerns regarding medicines.  The following changes have been made:  no change  Labs/ tests ordered today include:   No orders of the defined types were placed in this encounter.  Disposition:      Signed, Mertie Moores, MD  04/05/2022 3:06 PM    [facility], [place], [place]  [address] Phone: [phone]; Fax: [phone]

## 2022-03-27 ENCOUNTER — Ambulatory Visit: Payer: Commercial Managed Care - PPO | Attending: Cardiovascular Disease | Admitting: Cardiovascular Disease

## 2022-03-27 ENCOUNTER — Encounter: Payer: Self-pay | Admitting: Cardiovascular Disease

## 2022-03-27 VITALS — BP 102/58 | HR 55 | Ht 65.0 in | Wt 138.0 lb

## 2022-03-27 DIAGNOSIS — I480 Paroxysmal atrial fibrillation: Secondary | ICD-10-CM | POA: Diagnosis not present

## 2022-03-27 DIAGNOSIS — I7300 Raynaud's syndrome without gangrene: Secondary | ICD-10-CM | POA: Diagnosis not present

## 2022-03-27 DIAGNOSIS — E7841 Elevated Lipoprotein(a): Secondary | ICD-10-CM | POA: Diagnosis not present

## 2022-03-27 NOTE — Patient Instructions (Signed)
Medication Instructions:  Your physician recommends that you continue on your current medications as directed. Please refer to the Current Medication list given to you today. *If you need a refill on your cardiac medications before your next appointment, please call your pharmacy*   Lab Work: None Ordered If you have labs (blood work) drawn today and your tests are completely normal, you will receive your results only by: [place] (if you have MyChart) OR A paper copy in the mail If you have any lab test that is abnormal or we need to change your treatment, we will call you to review the results.   Testing/Procedures: None Ordered   Follow-Up: At [facility], you and your health needs are our priority.  As part of our continuing mission to provide you with exceptional heart care, we have created designated Provider Care Teams.  These Care Teams include your primary Cardiologist (physician) and Advanced Practice Providers (APPs -  Physician Assistants and Nurse Practitioners) who all work together to provide you with the care you need, when you need it.  We recommend signing up for the patient portal called "MyChart".  Sign up information is provided on this After Visit Summary.  MyChart is used to connect with patients for Virtual Visits (Telemedicine).  Patients are able to view lab/test results, encounter notes, upcoming appointments, etc.  Non-urgent messages can be sent to your provider as well.   To learn more about what you can do with MyChart, go to [email or web address].    Your next appointment:   6 week(s)  Provider:   Mertie Moores, MD     Other Instructions

## 2022-04-09 ENCOUNTER — Other Ambulatory Visit: Payer: Self-pay | Admitting: Obstetrics and Gynecology

## 2022-04-09 DIAGNOSIS — N6310 Unspecified lump in the right breast, unspecified quadrant: Secondary | ICD-10-CM

## 2022-04-09 NOTE — Addendum Note (Signed)
Addended by: Marcelle Overlie D on: 04/09/2022 07:34 AM   Modules accepted: Orders

## 2022-05-11 ENCOUNTER — Ambulatory Visit: Payer: Commercial Managed Care - PPO

## 2022-05-25 ENCOUNTER — Ambulatory Visit: Payer: Commercial Managed Care - PPO | Attending: Cardiovascular Disease

## 2022-05-25 DIAGNOSIS — E782 Mixed hyperlipidemia: Secondary | ICD-10-CM

## 2022-05-26 LAB — HEPATIC FUNCTION PANEL
ALT: 21 IU/L (ref 0–32)
AST: 24 IU/L (ref 0–40)
Albumin: 4.2 g/dL (ref 3.8–4.9)
Alkaline Phosphatase: 70 IU/L (ref 44–121)
Bilirubin Total: 0.6 mg/dL (ref 0.0–1.2)
Bilirubin, Direct: 0.14 mg/dL (ref 0.00–0.40)
Total Protein: 6.1 g/dL (ref 6.0–8.5)

## 2022-05-26 LAB — NMR, LIPOPROFILE
Cholesterol, Total: 154 mg/dL (ref 100–199)
HDL Particle Number: 32.5 umol/L (ref >=30.5)
HDL-C: 76 mg/dL (ref >39)
LDL Particle Number: 498 nmol/L (ref <1000)
LDL Size: 21 nm (ref >20.5)
LDL-C (NIH Calc): 69 mg/dL (ref 0–99)
LP-IR Score: <25 (ref <=45)
Small LDL Particle Number: <90 nmol/L (ref <=527)
Triglycerides: 40 mg/dL (ref 0–149)

## 2022-05-26 LAB — APOLIPOPROTEIN B: Apolipoprotein B: 73 mg/dL (ref <90)

## 2022-05-29 ENCOUNTER — Ambulatory Visit
Admission: RE | Admit: 2022-05-29 | Discharge: 2022-05-29 | Disposition: A | Discharge status: Home / Self Care | Payer: Commercial Managed Care - PPO | Source: Ambulatory Visit | Attending: Obstetrics and Gynecology | Admitting: Obstetrics and Gynecology

## 2022-05-29 DIAGNOSIS — N6310 Unspecified lump in the right breast, unspecified quadrant: Secondary | ICD-10-CM

## 2022-06-12 ENCOUNTER — Encounter: Payer: Self-pay | Admitting: Neurology

## 2022-07-03 ENCOUNTER — Encounter: Payer: Self-pay | Admitting: Neurology

## 2022-07-03 ENCOUNTER — Ambulatory Visit (INDEPENDENT_AMBULATORY_CARE_PROVIDER_SITE_OTHER): Payer: Commercial Managed Care - PPO | Admitting: Neurology

## 2022-07-03 VITALS — BP 119/78 | HR 83 | Ht 65.0 in | Wt 134.0 lb

## 2022-07-03 DIAGNOSIS — H532 Diplopia: Secondary | ICD-10-CM | POA: Diagnosis not present

## 2022-07-03 NOTE — Patient Instructions (Signed)
We will contact your primary care doctors office for myasthenia gravis results  Check MRI/A head

## 2022-07-03 NOTE — Progress Notes (Signed)
[facility] Clinic Note - Initial Visit   Date: 07/03/2022   ANQUANETTE BAHNER MRN: [id number] DOB: [birth year]   Dear Dr. Timothy Lasso:  Thank you for your kind referral of TICHINA KOEBEL for consultation of double vision. Although her history is well known to you, please allow [place] to reiterate it for the purpose of our medical record. The patient was accompanied to the clinic by self.    NOELLA KIPNIS is a 57 y.o. right-handed female with hyperlipidemia, SVT, and PAF presenting for evaluation of double vision.   IMPRESSION/PLAN: Episodic vertical diplopia triggered by exertion.  Normal neurological exam.    I will check MRI/A brain to evaluate for vascular abnormalities and other intracranial pathology.  I will also request MG panel from PCP's office.  If normal, migraine variant may be considered, but it is atypical to only occur with exertion.    Further recommendations pending results.   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  Starting in 2020, she has vertical double vision which only occurs when she is playing tennis and does not occur every time.  Covering one eye does make her double vision go away.  She has a mild pain over her eyebrows and then develops vertical diplopia, which lasts the entity of the game and subsides by the time she is walking off the court. She has noticed that a cold towel application to her face has reduce the intensity of the event.  Patient reports being testing for myasthenia in the past.    Out-side paper records, electronic medical record, and images have been reviewed where available and summarized as:  Lab Results  Component Value Date   TSH 1.060 10/03/2016     Past Medical History:  Diagnosis Date   Erythema nodosum    Heart murmur    MVP   Seasonal allergies     Past Surgical History:   Procedure Laterality Date   CHOLECYSTECTOMY  1995     Medications:  Outpatient Encounter Medications as of 07/03/2022  Medication Sig   Calcium Carb-Cholecalciferol (CALCIUM 500+D) 500-200 MG-UNIT TABS Take 1 tablet by mouth daily in the afternoon.   dibucaine (NUPERCAINAL) 1 % OINT 1 application to affected area as needed   estradiol (ESTRACE) 0.1 MG/GM vaginal cream INSERT PEA SIZE AMOUNT INTO VAGINA EVERY NIGHT FOR 2 WEEKS, THEN TWICE WEEKLY   glucosamine-chondroitin 500-400 MG tablet Take 2 tablets by mouth every morning.   Multiple Vitamin (MULTI-VITAMIN PO) Take 1 tablet by mouth daily in the afternoon.   Omega 3 1000 MG CAPS 1 capsule   rosuvastatin (CRESTOR) 20 MG tablet Take 1 tablet (20 mg total) by mouth daily.   triamcinolone cream (KENALOG) 0.5 % 1 Application as needed. 1 application to affected area   [DISCONTINUED] propranolol (INDERAL) 10 MG tablet Take 1 tablet (10 mg total) by mouth 4 (four) times daily as needed (palpitations). (Patient not taking: Reported on 07/03/2022)   No facility-administered encounter medications on file as of 07/03/2022.    Allergies: No Known Allergies  Family History: Family History  Problem Relation Age of Onset   Heart attack Father    Arrhythmia Mother    Diabetes Mother     Social History: Social History   Tobacco Use   Smoking status: Never   Smokeless tobacco: Never  Vaping Use   Vaping Use: Never used  Substance Use Topics   Alcohol use: Yes  Comment: Occasional Drink 1-2 times a month   Drug use: No   Social History   Social History Narrative   Right Handed    Lives in a two story home. Lives alone and with daughter when she comes home from college.     Vital Signs:  BP 119/78   Pulse 83   Ht 5\' 5"  (1.651 m)   Wt 134 lb (60.8 kg)   SpO2 92%   BMI 22.30 kg/m    Neurological Exam: MENTAL STATUS including orientation to time, place, person, recent and remote memory, attention span and concentration,  language, and fund of knowledge is normal.  Speech is not dysarthric.  CRANIAL NERVES: II:  No visual field defects.     III-IV-VI: Pupils equal round and reactive to light.  Normal conjugate, extra-ocular eye movements in all directions of gaze.  No nystagmus.  No ptosis.   V:  Normal facial sensation.    VII:  Normal facial symmetry and movements.   VIII:  Normal hearing and vestibular function.   IX-X:  Normal palatal movement.   XI:  Normal shoulder shrug and head rotation.   XII:  Normal tongue strength and range of motion, no deviation or fasciculation.  MOTOR:  Motor strength is 5/5 throughout.  No atrophy, fasciculations or abnormal movements.  No pronator drift.   MSRs:                                           Right        Left brachioradialis 2+  2+  biceps 2+  2+  triceps 2+  2+  patellar 2+  2+  ankle jerk 2+  2+  Hoffman no  no  plantar response down  down   SENSORY:  Normal and symmetric perception of light touch, pinprick, vibration, and proprioception.  Romberg's sign absent.   COORDINATION/GAIT: Normal finger-to- nose-finger.  Intact rapid alternating movements bilaterally.  Able to rise from a chair without using arms.  Gait narrow based and stable. Tandem and stressed gait intact.    Thank you for allowing me to participate in patient's care.  If I can answer any additional questions, I would be pleased to do so.    Sincerely,    Pheobe Sandiford K. Allena Katz, DO

## 2022-07-04 ENCOUNTER — Telehealth: Payer: Self-pay

## 2022-07-04 NOTE — Telephone Encounter (Signed)
Called patients PCP Dr. Ferd Hibbs office and left a detailed message for Medical Records. Asked for Myasthenia Panel Gravis and Acetyl Colon Antibodies lab to be faxed over to [place]. Provided our office fax and contact information.

## 2022-07-05 NOTE — Progress Notes (Signed)
Addendum  Myasthenia gravis panel 04/30/2017 performed at PCP's office labs rec'd:  AChR binding, blocking, modulating, and striation antibodies are negative.

## 2022-07-20 ENCOUNTER — Ambulatory Visit
Admission: RE | Admit: 2022-07-20 | Discharge: 2022-07-20 | Disposition: A | Discharge status: Home / Self Care | Payer: Commercial Managed Care - PPO | Source: Ambulatory Visit | Attending: Neurology | Admitting: Neurology

## 2022-07-20 ENCOUNTER — Other Ambulatory Visit: Payer: Self-pay | Admitting: Neurology

## 2022-07-20 DIAGNOSIS — H532 Diplopia: Secondary | ICD-10-CM

## 2022-07-20 MED ORDER — GADOPICLENOL 0.5 MMOL/ML IV SOLN
7.0000 mL | Freq: Once | INTRAVENOUS | Status: AC | PRN
  Administered 2022-07-20: 7 mL via INTRAVENOUS

## 2022-09-28 ENCOUNTER — Ambulatory Visit: Payer: Commercial Managed Care - PPO | Admitting: Cardiovascular Disease

## 2023-01-19 ENCOUNTER — Encounter: Payer: Self-pay | Admitting: Cardiovascular Disease

## 2023-01-19 NOTE — Progress Notes (Signed)
 Cardiology Office Note   Date:  01/22/2023   ID:  Gabriella Grimes, DOB Mar 21, 1965, MRN [id number]  PCP:  Onita Rush, MD  Cardiologist:   Aleene Passe, MD   Chief Complaint  Patient presents with   Hyperlipidemia   Atrial Fibrillation           Previous notes.  Gabriella Grimes is a 58 y.o. female who presents for follow up of her paroxysmal atrial fib.    has had intermittent palpitations and atypical chest pain for the past several years. I saw her during a house visit on December 24. She was having heartbeat irregularities. . Clinically the rhythm felt like atrial fibrillation. She was having some chest discomfort but her rhythm was completely normal. We placed an event monitor.  Event monitor reveals predominantly normal sinus rhythm. She has frequent premature atrial contractions , occasional PVCs. She has also had some runs of paroxysmal atrial fibrillation.  She remains active.  She hurt her back so she has not been as active as she normally is. Runs on occasion.    Jan. 23, 2018:  Doing well. Thinks she has had an episode of SVT and another episode of PAF .   Was playing tennis at the time.   Felt a little faint but played through the episode.  Associated with a stressful time  Still running some.   Does the elllptical and stair climber regularly .     Feb. 13, 2018:  Gabriella Grimes Is seen today as a work in visit. She started having episodes of tachycardia while playing tennis. Her rate was around 110. She was brought in for an EKG and further evaluation.  Her EKG revealed junctional tachycardia at a rate of 110. She had retrograde P waves. She was hemodynamically very stable.  Sept. 26, 2018:    No tachycardia .  Occasional short burst ( several seconds)  Seems to be more stress  Rare episodes of CP - not related to activity  Wants to have LPa drawn .    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  A coronary calcium  score is 1.  We started her on low-dose rosuvastatin .   She stopped the Rosuvastatin  because of some eyelid drooping  ( found an article related to rosuvastatin  and lip drooping   Nov. 24, 2020:  Gabriella Grimes is seen today for follow up of her PAF and hyperlipidemia .  Her coronary calcium  score was 1 in 2018. Doing well .  Has occasional palpitations. Had some palps while driving to work the other day  HR was ~ 105.   She thought it felt regular  Playing tennis regulalry .   Strained her back last week   Has elevated LPa LDL, HDL , Trigs look great  Has tried statins in the past.   Had some double visioin and lid droop and she stopped it .     I've discussed with Dr. Mona the issue of an elevated LPa in the setting of normal chol levels. At present the only agents that lower LPa are the PCSK-9 inhibitors and she likely is not a candidate since her chol levels are so good. There may be some agents available to specifically lower LPa in the future but not available now.   Jan. 7, 2022: Gabriella Grimes is seen  for follow up of her PAF and elevated LPa  Has intermittant PAF , only lasts for a few seconds ( < 30 )  Has intermittant cp  No episodes of tachycardia .  She had junctional tachycardia in May, 2018.   April 19, 2021 Gabriella Grimes is seen for follow up of her PAF , elevated LPa, Has PAF,  had several episodes over the past several weeks  The episodes are rare and random. Apple watch showed PAF. Converted back to NSR within minutes. May be related to volume depletion. Discussed Liquid IV  Does not seem to be associated with hot flashes .  Not necessarly due to caffeine -  Does not alter her caffeine  Is not related to activity .  Has Raynaulds - affects R thumb Sister has a clotting disorder   Still having double vision - only on the tennis courts, after playing for a while .  We discussed  cramping in her feet and legs.  She suggested that she try magnesium and I completely agree with that.  As it is probably magnesium or potassium deficiency along with some dehydration.  I reassured her that this is not a cardiac issue.  Her pulses are intact   Nov. 7, 2023  Gabriella Grimes is seen for follow up of her PAF,  + family hx of CAD  Coronary artery calcium  score was 1 ( 80th percentile for age / sex matched controls )   Echo from Oct. 2014 shows normal LV function , grade I DD. Trivial MR , mild LAE  Has raynauds syndrome   No significant episodes of palpitation  Labs from Dr. Onita  Chol =  HDL =  LDL =  Trigs =   Dec. 14, 2023 Gabriella Grimes is seen for follow up of a recent episode of Atrial fib Hx of HLD,  Last LDL in Jan. 2022 is 135.  Lipid levels from her primary medical doctor from October 24, 2021  Total cholesterol is 204 Triglyceride level is 36 HDL is 60  LDL is 137 Lp(a) is 249  Is repeating her coronary calcium  scan with Dr. Onita.     She had an episode of atrial fib with RVR vs. Junctional tachycardia earlier this week   Takes propranolol  as needed for her palpitations  Has eliquis  at the pharmacy - ready to pick up    March 27, 2022: Gabriella Grimes is seen today for follow-up of her paroxysmal atrial fibrillation.  She also has a history of hyperlipidemia, elevated LP(a) and a positive family history of coronary artery disease.  Repeat coronary calcium  score is 55.5 which places her in the 89th percentile for age and sex matched controls.  Previous coronary artery calcium  score from October, 2018 was 1 which places her in the 80th percentile for age and sex matched controls.  She has been working with our lipid clinic trying to dial in the appropriate dose of her statin.  She is having some muscle aches and pains. Tried Rosuvastatin  20 mg again .    She had some unusual pains Hand pain, foot pain , neck click sound  Is now on rosuvastatin  10 mg a day   Jan. 14,  2025 Gabriella Grimes is seen for follow up for her HLD ,   elevated LP(a) PAF  She has had 2 coronary calcium  scores.    Her most recent calcium  score was January, 2024.  Her score was 55.5 which places her in the 89th percentile for age and sex matched controls.  Her lipoprotein a level is 249 .     she has a strong family history of premature coronary artery disease.    Playing tennis and pickle ball regularlly   She has only rare episodes of palpitations which may or may not be paroxysmal atrial fibrillation.    Past Medical History:  Diagnosis Date   Erythema nodosum    Heart murmur    MVP   Seasonal allergies     Past Surgical History:  Procedure Laterality Date   CHOLECYSTECTOMY  1995     Current Outpatient Medications  Medication Sig Dispense Refill   dibucaine (NUPERCAINAL) 1 % OINT 1 application to affected area as needed     estradiol (ESTRACE) 0.1 MG/GM vaginal cream INSERT PEA SIZE AMOUNT INTO VAGINA EVERY NIGHT FOR 2 WEEKS, THEN TWICE WEEKLY     glucosamine-chondroitin 500-400 MG tablet Take 2 tablets by mouth every morning.     Multiple Vitamin (MULTI-VITAMIN PO) Take 1 tablet by mouth daily in the afternoon.     Omega 3 1000 MG CAPS 1 capsule     rosuvastatin  (CRESTOR ) 20 MG tablet Take 1 tablet (20 mg total) by mouth daily. 90 tablet 3   triamcinolone cream (KENALOG) 0.5 % 1 Application as needed. 1 application to affected area     No current facility-administered medications for this visit.    Allergies:   Patient has no known allergies.    Social History:  The patient  reports that she has never smoked. She has never used smokeless tobacco. She reports current alcohol use. She reports that she does not use drugs.   Family History:  The patient's family history includes Arrhythmia in her mother; Diabetes in her mother; Heart attack in her father.    ROS:  Please see the history of present illness.   Otherwise, review of systems are positive for occasional back  pains.  Occasional typical CP .   All other systems are reviewed and negative.     Physical Exam: Blood pressure 112/68, pulse (!) 52, height 5' 5 (1.651 m), weight 135 lb 12.8 oz (61.6 kg), SpO2 98%.       GEN:  Well nourished, well developed in no acute distress HEENT: Normal NECK: No JVD; No carotid bruits LYMPHATICS: No lymphadenopathy CARDIAC: RRR , very soft systolic murmur  RESPIRATORY:  Clear to auscultation without rales, wheezing or rhonchi  ABDOMEN: Soft, non-tender, non-distended MUSCULOSKELETAL:  No edema; No deformity  SKIN: Warm and dry NEUROLOGIC:  Alert and oriented x 3      EKG:      EKG Interpretation Date/Time:  Tuesday January 22 2023 09:36:57 EST Ventricular Rate:  53 PR Interval:  158 QRS Duration:  74 QT Interval:  428 QTC Calculation: 401 R Axis:   82  Text Interpretation: Sinus bradycardia No previous ECGs available Confirmed by Alveta Mungo ([id number]) on 01/22/2023 10:09:40 AM      Recent Labs: 05/25/2022: ALT 21    Lipid Panel    Component Value Date/Time   CHOL 141 03/03/2019 1004   TRIG 47 03/03/2019 1004   TRIG 48 12/26/2016 0910   HDL 73 03/03/2019 1004   HDL 68 12/26/2016 0910   CHOLHDL 1.9 03/03/2019 1004   LDLCALC 57 03/03/2019 1004      Wt Readings from Last 3 Encounters:  01/22/23 135 lb 12.8 oz (61.6 kg)  07/03/22 134 lb (60.8 kg)  03/27/22 138 lb (62.6 kg)      Other studies Reviewed: Additional studies/  records that were reviewed today include: 30 day event monitor. Review of the above records demonstrates: paroxysmal atrial fib   ASSESSMENT AND PLAN:   1.      Paroxysmal atrial fib:     continues to have intermittant palpitations, very brief.  We have only seen very rare, brief episodes of PAF  CHADS2VASC is 1 ( female)   She has rare episodes of palpitations.  It is difficult to know whether these are actual episodes of PAF or other benign arrhythmias.  We have documented atrial fibrillation in the  past but it is a very low atrial fibrillation burden.  She has not been on anticoagulation because of a low CHA2DS2-VASc score.  I suggested that she probably be started on Eliquis  when she reaches age 38, certainly if she is continue to have episodes of PAF.  She may need additional monitors.  If she has episodes of PAF that are more troublesome I think she would do well with A-fib ablation.      2.  Elevated LPa:  249.    Her last LDL was 61.  She is on rosuvastatin .  At this time we will continue with the current dose of the rosuvastatin .  We discussed briefly that some of the PCSK9's may be beneficial for patients with elevated LP(a).  At present she is not a candidate for PCSK9 because her lipids are at goal on rosuvastatin .  The insurance companies may change in the future allowing her to take additional medications that would benefit someone with a high LP(a).        3. Family history of cardiac disease.      4.  Raynauds syndrome : Has not been much of a problem.         Current medicines are reviewed at length with the patient today.  The patient does not have concerns regarding medicines.  The following changes have been made:  no change  Labs/ tests ordered today include:   Orders Placed This Encounter  Procedures   EKG 12-Lead   Disposition:  1 year with Dr. Loni     Signed, Aleene Passe, MD  01/22/2023 10:12 AM    [facility] [address] [place], [place], [place]  [address] Phone: [phone]; Fax: [phone]

## 2023-01-22 ENCOUNTER — Ambulatory Visit: Payer: Commercial Managed Care - PPO | Attending: Cardiovascular Disease | Admitting: Cardiovascular Disease

## 2023-01-22 ENCOUNTER — Encounter: Payer: Self-pay | Admitting: Cardiovascular Disease

## 2023-01-22 VITALS — BP 112/68 | HR 52 | Ht 65.0 in | Wt 135.8 lb

## 2023-01-22 DIAGNOSIS — E782 Mixed hyperlipidemia: Secondary | ICD-10-CM

## 2023-01-22 DIAGNOSIS — I480 Paroxysmal atrial fibrillation: Secondary | ICD-10-CM | POA: Diagnosis not present

## 2023-01-22 NOTE — Patient Instructions (Signed)
 Follow-Up: At [facility], you and your health needs are our priority.  As part of our continuing mission to provide you with exceptional heart care, we have created designated Provider Care Teams.  These Care Teams include your primary Cardiologist (physician) and Advanced Practice Providers (APPs -  Physician Assistants and Nurse Practitioners) who all work together to provide you with the care you need, when you need it.  We recommend signing up for the patient portal called MyChart.  Sign up information is provided on this After Visit Summary.  MyChart is used to connect with patients for Virtual Visits (Telemedicine).  Patients are able to view lab/test results, encounter notes, upcoming appointments, etc.  Non-urgent messages can be sent to your provider as well.   To learn more about what you can do with MyChart, go to [email or web address].    Your next appointment:   1 year(s)  Provider:   Selma Merck, MD

## 2023-02-01 ENCOUNTER — Ambulatory Visit: Payer: Commercial Managed Care - PPO | Admitting: Cardiovascular Disease

## 2023-02-13 ENCOUNTER — Other Ambulatory Visit: Payer: Self-pay | Admitting: Cardiovascular Disease

## 2023-09-27 ENCOUNTER — Telehealth: Payer: Self-pay | Admitting: Internal Medicine

## 2023-09-27 NOTE — Telephone Encounter (Signed)
 Pt c/o medication issue:  1. Name of Medication: Atomoxepine  2. How are you currently taking this medication (dosage and times per day)? As written   3. Are you having a reaction (difficulty breathing--STAT)? No   4. What is your medication issue? Pt was told to make sure this medication is okay to take before starting.

## 2023-10-01 NOTE — Telephone Encounter (Signed)
 Left message to call back to discuss.

## 2023-10-01 NOTE — Telephone Encounter (Signed)
 Assuming this is atomoxetine,  This medication can increase heart-rate, would just be something to monitor for Increase in cardiovascular events is very rare Ok to take

## 2023-10-02 NOTE — Telephone Encounter (Signed)
 MyChart message sent to patient.

## 2023-12-27 ENCOUNTER — Encounter: Payer: Self-pay | Admitting: Internal Medicine

## 2024-01-20 ENCOUNTER — Encounter: Payer: Self-pay | Admitting: *Deleted

## 2024-01-21 ENCOUNTER — Encounter: Payer: Self-pay | Admitting: Internal Medicine

## 2024-01-21 ENCOUNTER — Ambulatory Visit: Attending: Internal Medicine | Admitting: Internal Medicine

## 2024-01-21 VITALS — BP 128/79 | HR 49 | Ht 65.0 in | Wt 145.0 lb

## 2024-01-21 DIAGNOSIS — E782 Mixed hyperlipidemia: Secondary | ICD-10-CM

## 2024-01-21 DIAGNOSIS — Z8249 Family history of ischemic heart disease and other diseases of the circulatory system: Secondary | ICD-10-CM

## 2024-01-21 DIAGNOSIS — I480 Paroxysmal atrial fibrillation: Secondary | ICD-10-CM

## 2024-01-21 DIAGNOSIS — I471 Supraventricular tachycardia, unspecified: Secondary | ICD-10-CM | POA: Diagnosis not present

## 2024-01-21 DIAGNOSIS — E7841 Elevated Lipoprotein(a): Secondary | ICD-10-CM

## 2024-01-21 NOTE — Progress Notes (Signed)
 " Cardiology Office Note:  .   Date:  01/21/2024  ID:  Gabriella Grimes, DOB Nov 17, 1965, MRN [id number] PCP: Onita Rush, MD  [facility] Providers Cardiologist:  Soyla DELENA Merck, MD    History of Present Illness: .   Gabriella Grimes is a 59 y.o. female.  Discussed the use of AI scribe software for clinical note transcription with the patient, who gave verbal consent to proceed.  History of Present Illness Gabriella Grimes is a 59 year old female with paroxysmal atrial fibrillation who presents for cardiovascular follow-up.  She has paroxysmal atrial fibrillation with prior episodes of intermittent palpitations and atypical chest pain. Her initial documented tachycardic episode occurred while playing tennis with heart rate to 144 bpm for three hours. Recently she has not had significant palpitations. She has no dizziness, fainting, or exertional intolerance. She is not taking any heart rate or blood pressure medications.  She has elevated lipoprotein A at 249 and a coronary calcium  score of 55.5 from January 2024. Her October 2024 lipid panel showed LDL 61, triglycerides 37, and HDL 67. She takes rosuvastatin  20 mg daily for hyperlipidemia. There is a family history of cardiac disease.  She previously used Strattera for ADD but stopped due to significant fatigue. She asked about Vyvanse and was told her primary care clinician is concerned about potential cardiac side effects. Can cautiously try if required for ADD, stop with any symptoms.  She plays tennis and is considering adding strength training to help maintain activity.    ROS: negative except per HPI above.  Studies Reviewed: SABRA   EKG Interpretation Date/Time:  Tuesday January 21 2024 09:21:06 EST Ventricular Rate:  49 PR Interval:  156 QRS Duration:  70 QT Interval:  436 QTC Calculation: 393 R Axis:   69  Text Interpretation: Sinus bradycardia When compared with ECG of 22-Jan-2023 09:36, No significant change was found  Confirmed by Merck Soyla ([id number]) on 01/21/2024 9:47:08 AM    Results Labs Lipoprotein A: 249 Lipid panel LDL (10/2022): LDL 78 increased from 61 prior to 10/2022 Lipid panel triglycerides (10/2022): Triglycerides 55 increased from 37 prior to 10/2022 Lipid panel HDL (10/2022): HDL 67 Lipid panel total cholesterol (10/2022): Total cholesterol 156  Radiology Coronary artery calcium  score (01/2022): Score 55.5  Diagnostic EKG (01/21/2024): Normal; sinus bradycardia, heart rate 49 bpm (Independently interpreted) Risk Assessment/Calculations:    CHA2DS2-VASc Score =     This indicates a  % annual risk of stroke. The patient's score is based upon:        Physical Exam:   VS:  BP 128/79 (BP Location: Left Arm, Patient Position: Sitting)   Pulse (!) 49   Ht 5' 5 (1.651 m)   Wt 145 lb (65.8 kg)   SpO2 100%   BMI 24.13 kg/m    Wt Readings from Last 3 Encounters:  01/21/24 145 lb (65.8 kg)  01/22/23 135 lb 12.8 oz (61.6 kg)  07/03/22 134 lb (60.8 kg)     Physical Exam VITALS: P- 49 GENERAL: Alert, cooperative, well developed, no acute distress HEENT: Normocephalic, normal oropharynx, moist mucous membranes CHEST: Clear to auscultation bilaterally, no wheezes, rhonchi, or crackles CARDIOVASCULAR: Bradycardia with normal heart sounds, S1 and S2 normal without murmurs ABDOMEN: Soft, non-tender, non-distended, without organomegaly, normal bowel sounds EXTREMITIES: No cyanosis or edema NEUROLOGICAL: Cranial nerves grossly intact, moves all extremities without gross motor or sensory deficit   ASSESSMENT AND PLAN: .    Assessment and Plan Assessment & Plan  Paroxysmal atrial fibrillation Intermittent palpitations with well-controlled episodes. EKG normal at SB 49 bpm. No current anticoagulation. Discussed Vyvanse trial for ADD with caution due to cardiac side effects. - Monitor for AFib symptoms, especially if starting Vyvanse. - Consider anticoagulation at age  53.  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 LDL slightly above target due to elevated lipoprotein(a) and coronary artery calcification. Discussed lifestyle modifications and aspirin  therapy consideration. - Continue rosuvastatin  20 mg daily. - Strength training 10 minutes three times a week. - Daily aerobic activity such as walking or stair climbing. - OK to take baby aspirin  81 mg daily if patient prefers, discussed guideline IIB indication.  Recording duration: 23 minutes      Soyla Merck, MD, [facility] "

## 2024-01-21 NOTE — Patient Instructions (Signed)
 Medication Instructions:  No Changes  Follow-Up: At [facility], you and your health needs are our priority.  As part of our continuing mission to provide you with exceptional heart care, our providers are all part of one team.  This team includes your primary Cardiologist (physician) and Advanced Practice Providers or APPs (Physician Assistants and Nurse Practitioners) who all work together to provide you with the care you need, when you need it.  Your next appointment:   1 year(s) (We will mail a reminder letter around Nov 2026; please call for a January 2027 appointment)  Provider:   Gayatri A Acharya, MD   Other Instructions Please call us  or send a MyChart message with any Cardiology related questions/concerns.  [phone].  Thank you!

## 2024-03-16 IMAGING — MG MM  DIGITAL DIAGNOSTIC BREAST BILAT IMPLANT W/ TOMO W/ CAD
8 of 12 series · 8 of 28 positions shown · non-contrast
Comparison: Previous exam(s).

CLINICAL DATA: 55-year-old female presenting for annual bilateral
mammogram and 1 year follow-up of a probably benign right breast
mass.

EXAM:
DIGITAL DIAGNOSTIC BILATERAL MAMMOGRAM WITH IMPLANTS, CAD AND
TOMOSYNTHESIS; ULTRASOUND RIGHT BREAST LIMITED
TECHNIQUE: Bilateral digital diagnostic mammography and breast tomosynthesis
was performed. The images were evaluated with computer-aided
detection. Standard and/or implant displaced views were performed.;
Targeted ultrasound examination of the right breast was performed

[L CC]
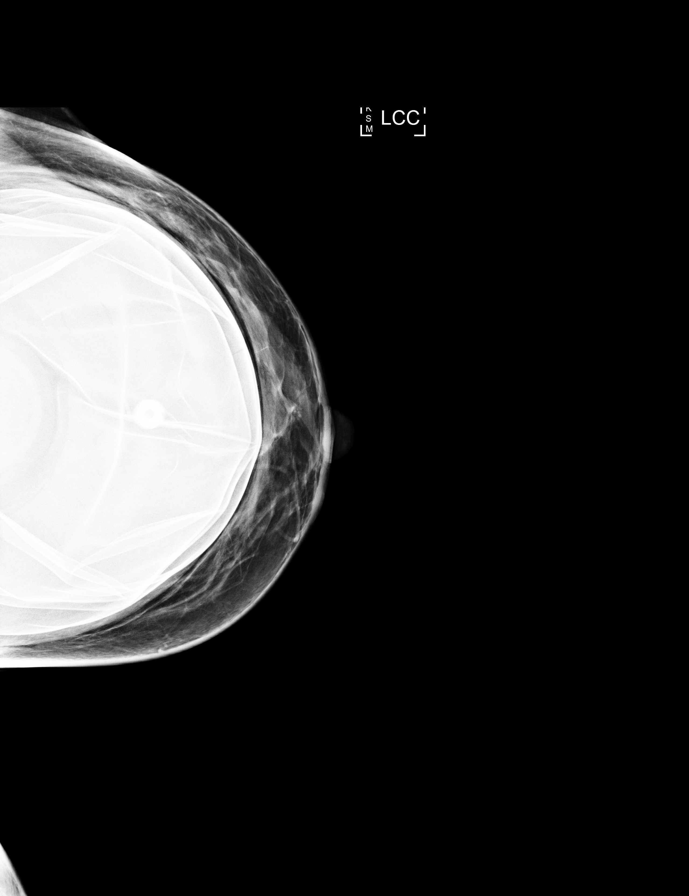

[R MLO]
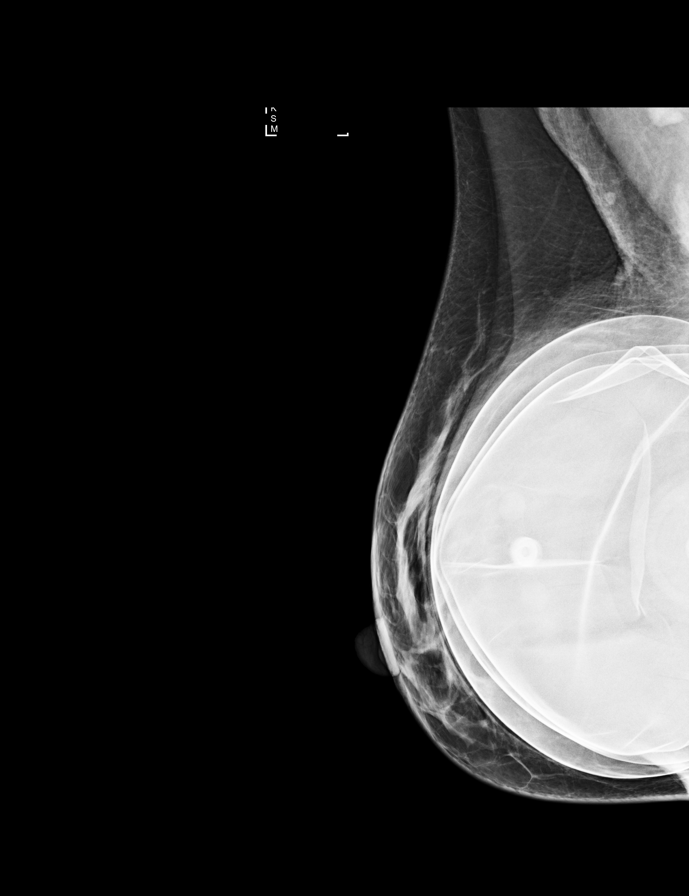

[R CC]
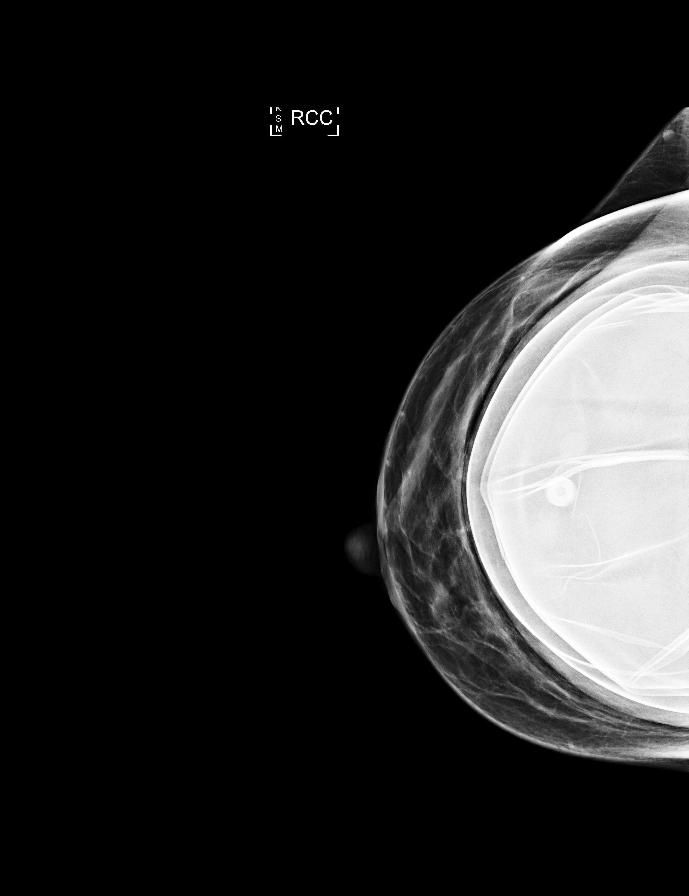

[L MLO]
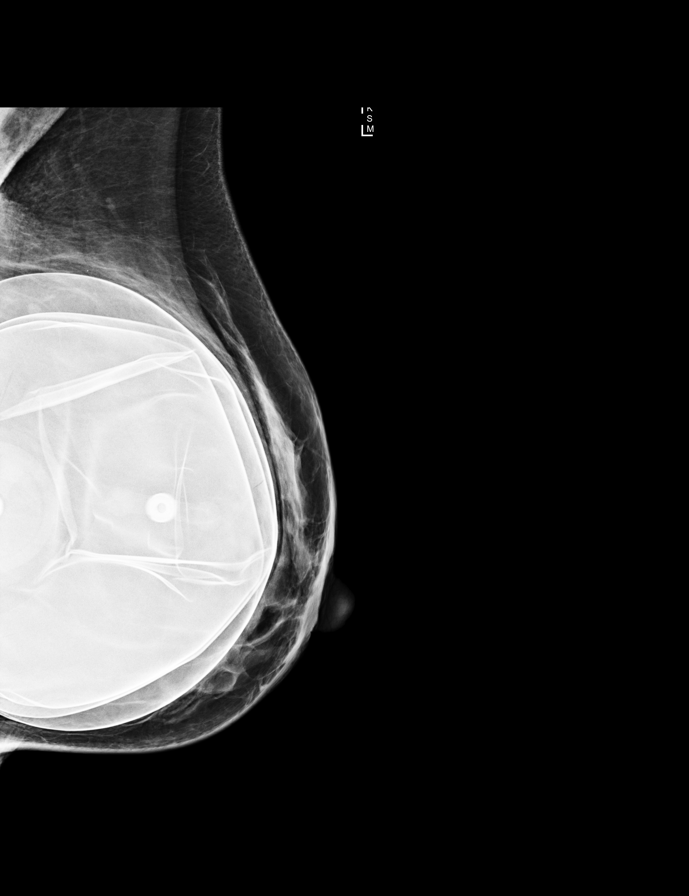

[R CC synth-2D]
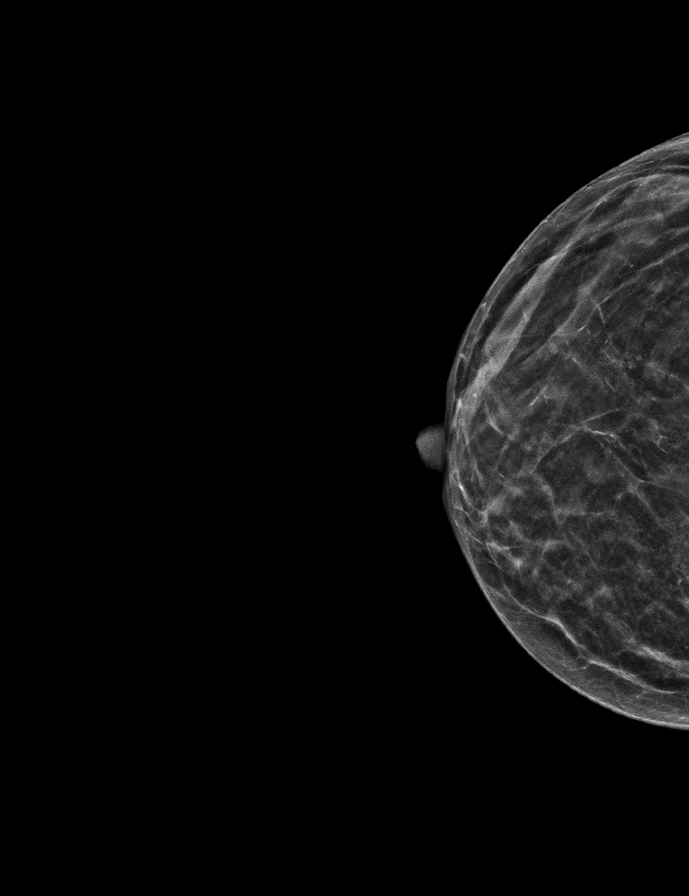

[L MLO synth-2D]
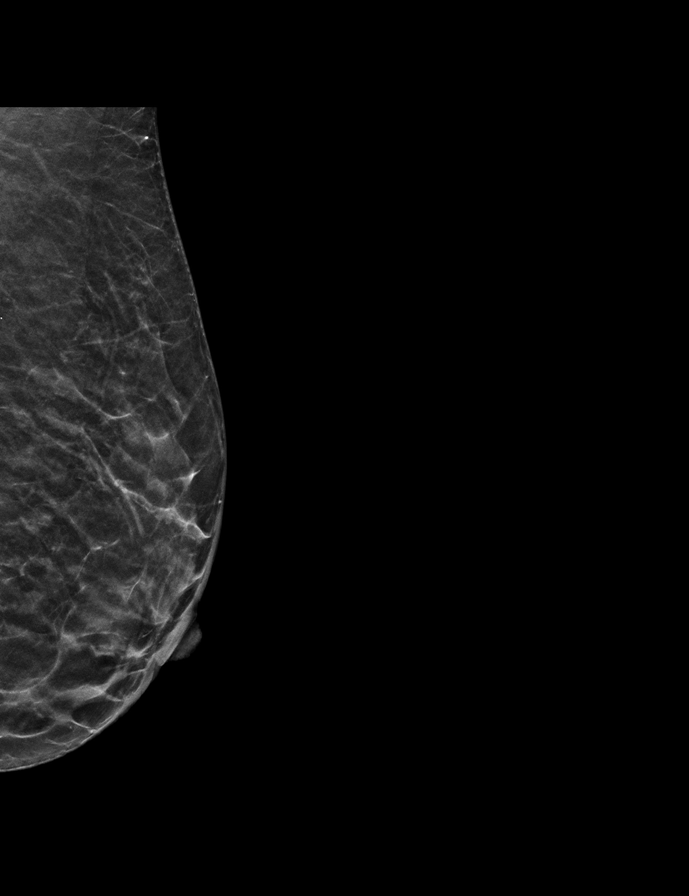

[L CC synth-2D]
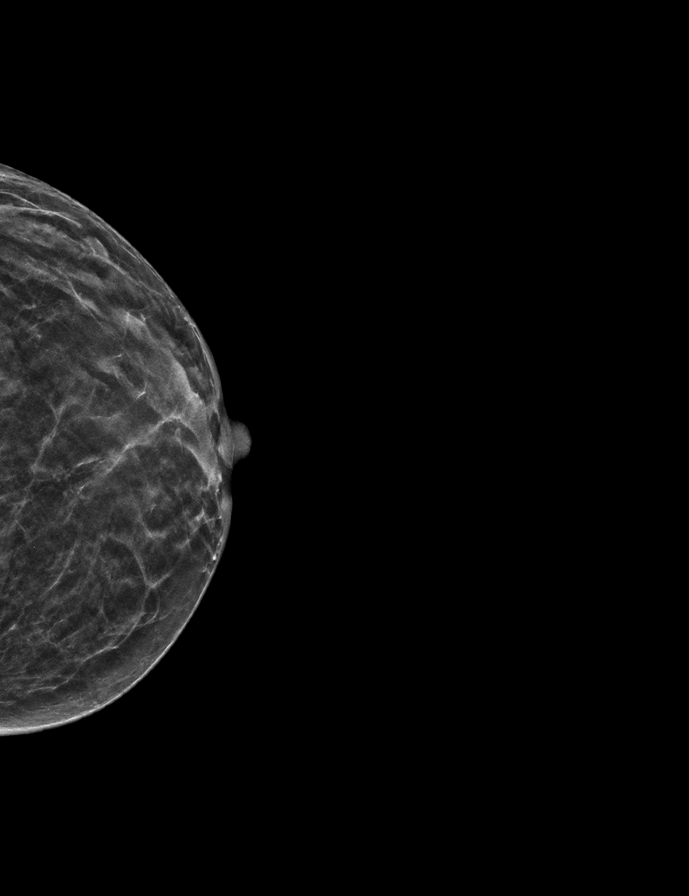

[R MLO synth-2D]
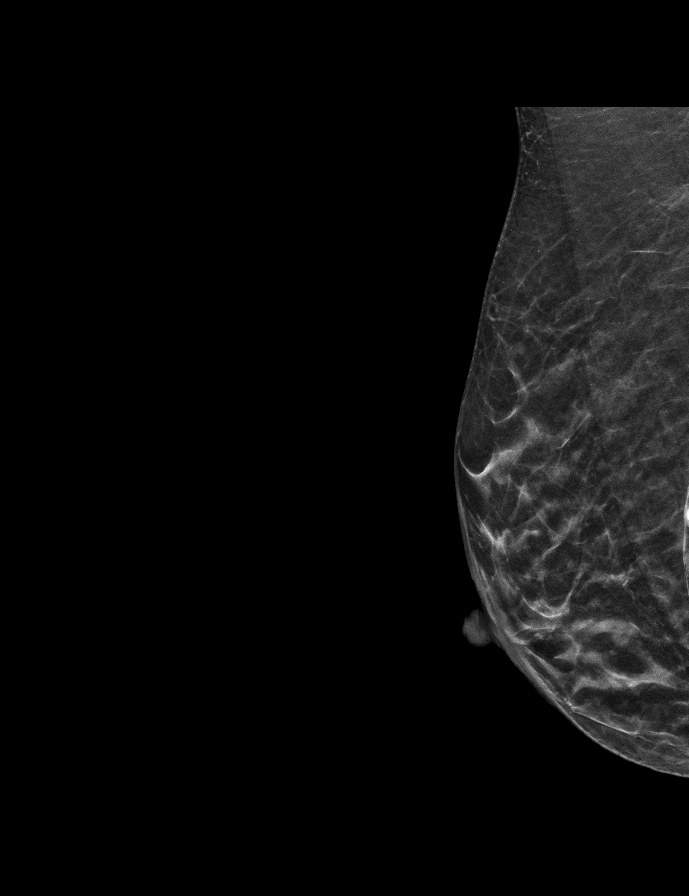

[8 of 28 positions shown; findings below may reference images not displayed]

ACR Breast Density Category c: The breast tissue is heterogeneously
dense, which may obscure small masses.
FINDINGS: Mammographic evaluation of the bilateral breasts is unchanged. No
new or suspicious findings identified. The patient has retropectoral
implants.

Targeted ultrasound is performed, showing stable appearance of an
oval, circumscribed hypoechoic mass at the 12 o'clock position 1 cm
from the nipple on the right. It measures 8 x 2 x 6 mm (previously 8
x 2 x 6 mm).
IMPRESSION: 1. Stable, probably benign right breast mass. Recommend a final
follow-up in 1 year.
2. No mammographic evidence of malignancy on the left.

RECOMMENDATION:
Bilateral diagnostic mammogram and right breast ultrasound in 1
year.

I have discussed the findings and recommendations with the patient.
If applicable, a reminder letter will be sent to the patient
regarding the next appointment.

BI-RADS CATEGORY  3: Probably benign.

## 2024-03-16 IMAGING — US US BREAST*R* LIMITED INC AXILLA
1 series · 5 of 5 positions shown · non-contrast
Comparison: Previous exam(s).

CLINICAL DATA: 55-year-old female presenting for annual bilateral
mammogram and 1 year follow-up of a probably benign right breast
mass.

EXAM:
DIGITAL DIAGNOSTIC BILATERAL MAMMOGRAM WITH IMPLANTS, CAD AND
TOMOSYNTHESIS; ULTRASOUND RIGHT BREAST LIMITED
TECHNIQUE: Bilateral digital diagnostic mammography and breast tomosynthesis
was performed. The images were evaluated with computer-aided
detection. Standard and/or implant displaced views were performed.;
Targeted ultrasound examination of the right breast was performed

[Series 1: us breast*right* limited inc axilla · 0.05mm/px · 5 of 5 slices shown]
[im 1/5]
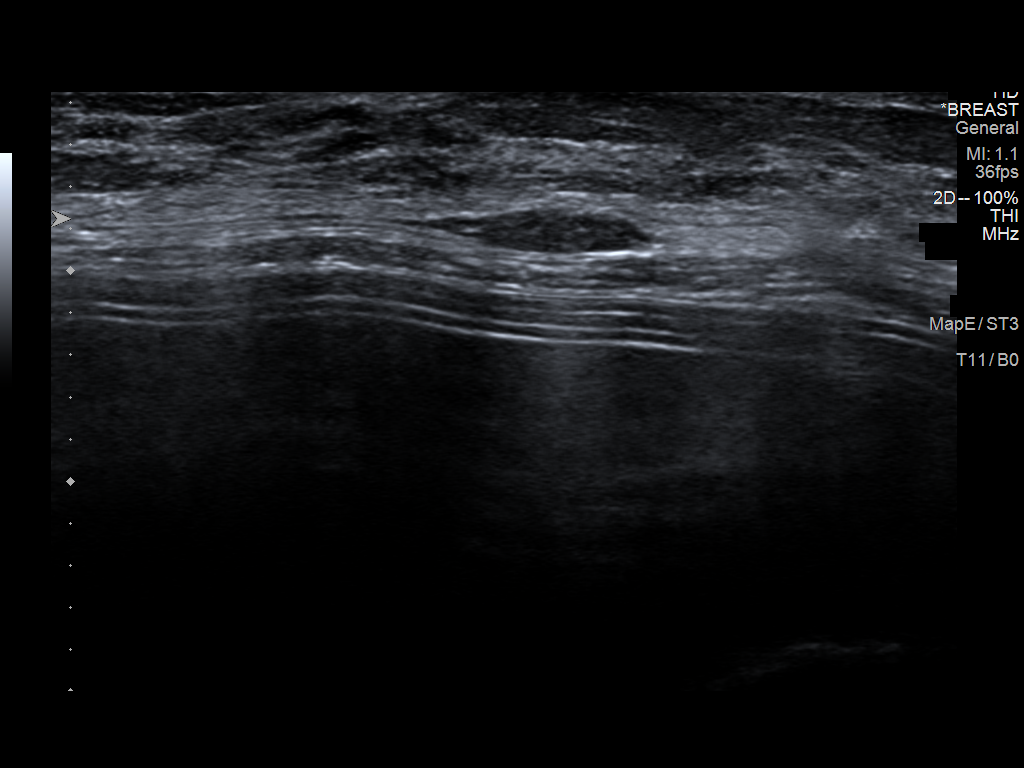
[im 2/5]
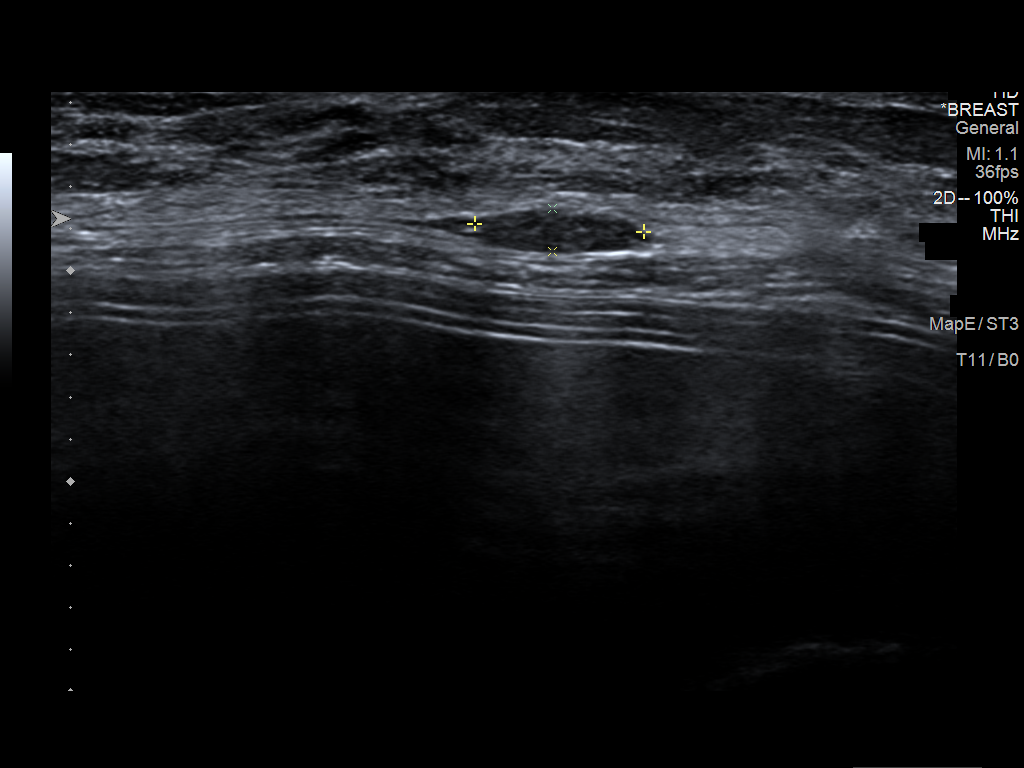
[im 3/5]
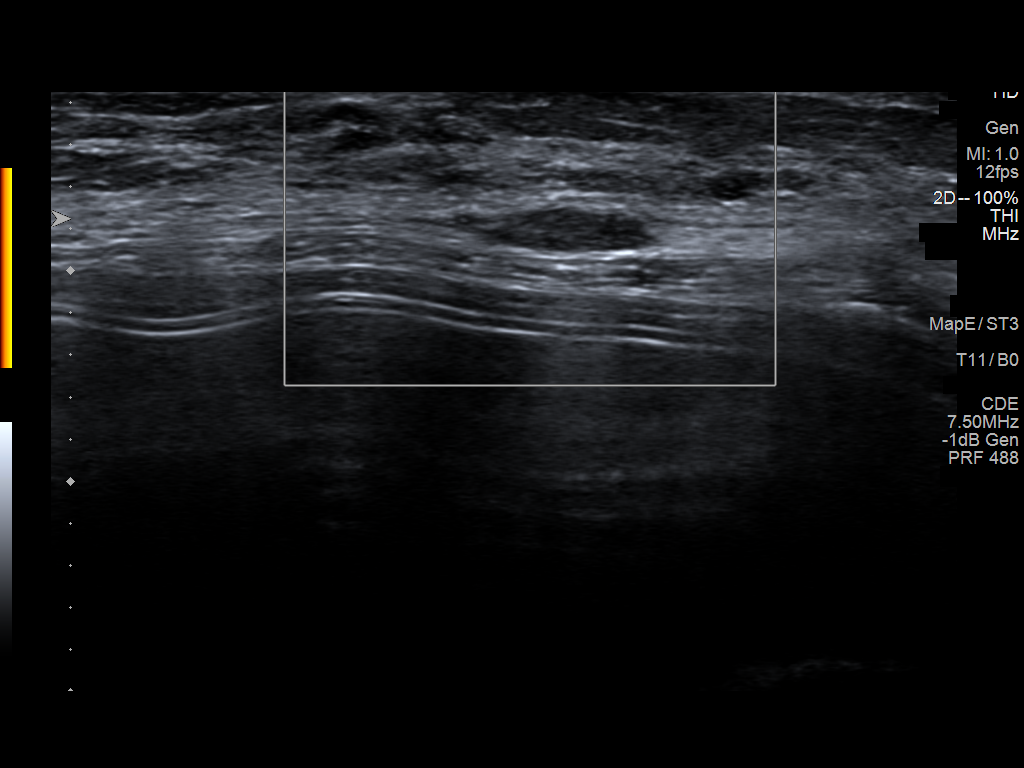
[im 4/5]
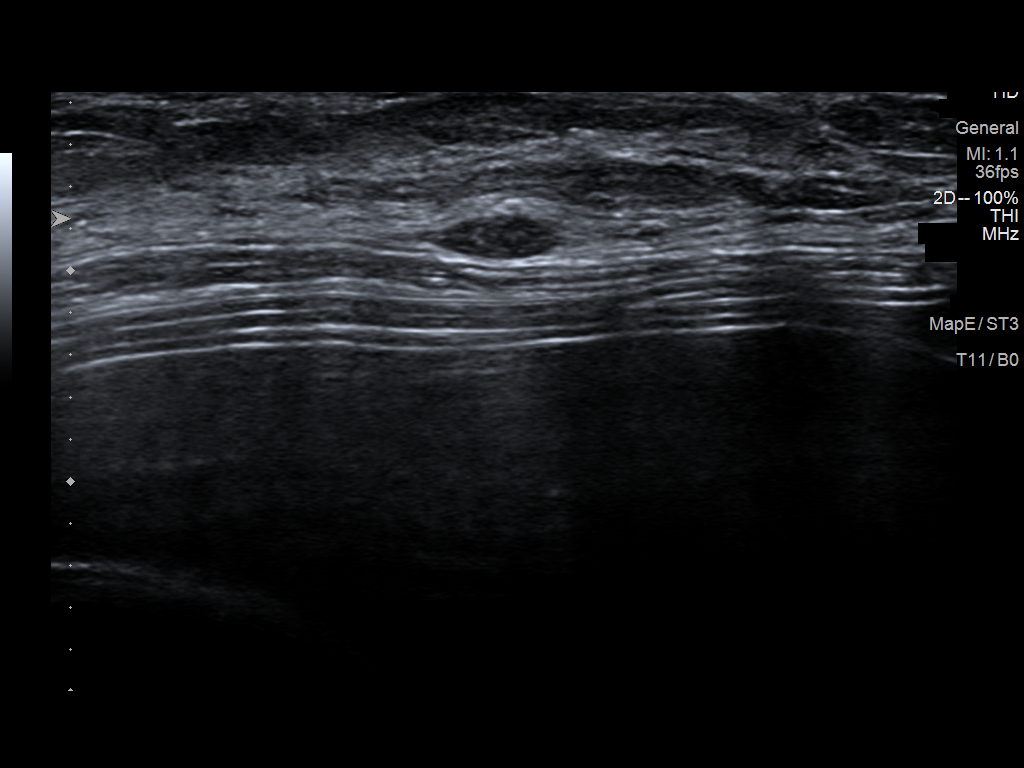
[im 5/5]
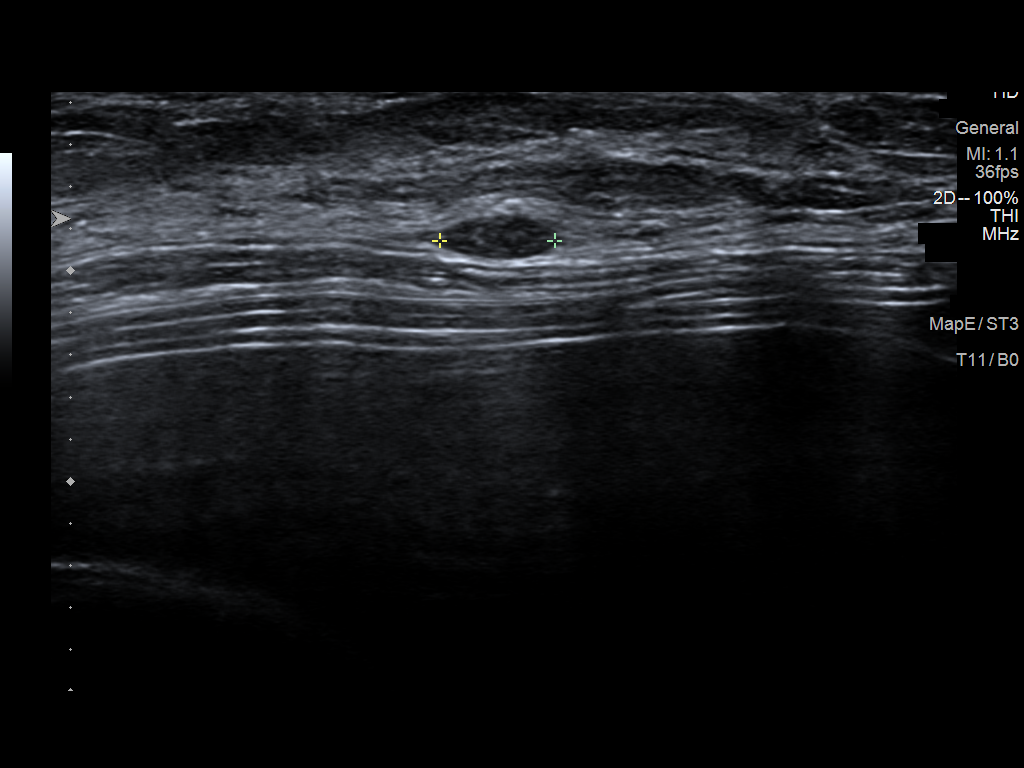

[5 of 5 positions shown; findings below may reference images not displayed]

ACR Breast Density Category c: The breast tissue is heterogeneously
dense, which may obscure small masses.
FINDINGS: Mammographic evaluation of the bilateral breasts is unchanged. No
new or suspicious findings identified. The patient has retropectoral
implants.

Targeted ultrasound is performed, showing stable appearance of an
oval, circumscribed hypoechoic mass at the 12 o'clock position 1 cm
from the nipple on the right. It measures 8 x 2 x 6 mm (previously 8
x 2 x 6 mm).
IMPRESSION: 1. Stable, probably benign right breast mass. Recommend a final
follow-up in 1 year.
2. No mammographic evidence of malignancy on the left.

RECOMMENDATION:
Bilateral diagnostic mammogram and right breast ultrasound in 1
year.

I have discussed the findings and recommendations with the patient.
If applicable, a reminder letter will be sent to the patient
regarding the next appointment.

BI-RADS CATEGORY  3: Probably benign.
